# Patient Record
Sex: Female | Born: 1955 | Race: Black or African American | Hispanic: No | Smoking: Former smoker
Health system: Southern US, Community
[De-identification: ages and names within clinical notes are randomized; demographics above are authoritative.]

## PROBLEM LIST (undated history)

## (undated) DIAGNOSIS — Z9109 Other allergy status, other than to drugs and biological substances: Secondary | ICD-10-CM

## (undated) DIAGNOSIS — W19XXXA Unspecified fall, initial encounter: Secondary | ICD-10-CM

## (undated) DIAGNOSIS — M5430 Sciatica, unspecified side: Secondary | ICD-10-CM

## (undated) DIAGNOSIS — R51 Headache: Secondary | ICD-10-CM

## (undated) DIAGNOSIS — G8929 Other chronic pain: Secondary | ICD-10-CM

## (undated) DIAGNOSIS — F419 Anxiety disorder, unspecified: Secondary | ICD-10-CM

## (undated) DIAGNOSIS — M1990 Unspecified osteoarthritis, unspecified site: Secondary | ICD-10-CM

## (undated) DIAGNOSIS — Z8619 Personal history of other infectious and parasitic diseases: Secondary | ICD-10-CM

## (undated) DIAGNOSIS — F32A Depression, unspecified: Secondary | ICD-10-CM

## (undated) DIAGNOSIS — M549 Dorsalgia, unspecified: Secondary | ICD-10-CM

## (undated) DIAGNOSIS — K219 Gastro-esophageal reflux disease without esophagitis: Secondary | ICD-10-CM

## (undated) DIAGNOSIS — J42 Unspecified chronic bronchitis: Secondary | ICD-10-CM

## (undated) DIAGNOSIS — G2581 Restless legs syndrome: Secondary | ICD-10-CM

## (undated) DIAGNOSIS — D649 Anemia, unspecified: Secondary | ICD-10-CM

## (undated) DIAGNOSIS — G4700 Insomnia, unspecified: Secondary | ICD-10-CM

## (undated) DIAGNOSIS — R519 Headache, unspecified: Secondary | ICD-10-CM

## (undated) DIAGNOSIS — F329 Major depressive disorder, single episode, unspecified: Secondary | ICD-10-CM

## (undated) DIAGNOSIS — IMO0001 Reserved for inherently not codable concepts without codable children: Secondary | ICD-10-CM

## (undated) HISTORY — DX: Unspecified osteoarthritis, unspecified site: M19.90

## (undated) HISTORY — DX: Anxiety disorder, unspecified: F41.9

## (undated) HISTORY — DX: Insomnia, unspecified: G47.00

## (undated) HISTORY — DX: Restless legs syndrome: G25.81

---

## 1989-01-21 HISTORY — PX: TUBAL LIGATION: SHX77

## 1989-01-21 HISTORY — PX: CHOLECYSTECTOMY: SHX55

## 2004-03-22 ENCOUNTER — Emergency Department (HOSPITAL_COMMUNITY): Admission: EM | Admit: 2004-03-22 | Discharge: 2004-03-22 | Payer: Self-pay | Admitting: Family Medicine

## 2004-10-18 ENCOUNTER — Emergency Department (HOSPITAL_COMMUNITY): Admission: AD | Admit: 2004-10-18 | Discharge: 2004-10-18 | Payer: Self-pay | Admitting: Family Medicine

## 2005-09-14 ENCOUNTER — Emergency Department (HOSPITAL_COMMUNITY): Admission: EM | Admit: 2005-09-14 | Discharge: 2005-09-14 | Payer: Self-pay | Admitting: Family Medicine

## 2008-03-24 ENCOUNTER — Emergency Department (HOSPITAL_COMMUNITY): Admission: EM | Admit: 2008-03-24 | Discharge: 2008-03-24 | Payer: Self-pay | Admitting: Emergency Medicine

## 2008-04-21 ENCOUNTER — Emergency Department (HOSPITAL_COMMUNITY): Admission: EM | Admit: 2008-04-21 | Discharge: 2008-04-21 | Payer: Self-pay | Admitting: Emergency Medicine

## 2008-05-23 DIAGNOSIS — Z8619 Personal history of other infectious and parasitic diseases: Secondary | ICD-10-CM

## 2008-05-23 HISTORY — DX: Personal history of other infectious and parasitic diseases: Z86.19

## 2008-06-05 ENCOUNTER — Ambulatory Visit: Payer: Self-pay | Admitting: Obstetrics & Gynecology

## 2008-06-09 ENCOUNTER — Ambulatory Visit (HOSPITAL_COMMUNITY): Admission: RE | Admit: 2008-06-09 | Discharge: 2008-06-09 | Payer: Self-pay | Admitting: Obstetrics and Gynecology

## 2008-07-03 ENCOUNTER — Ambulatory Visit: Payer: Self-pay | Admitting: Obstetrics and Gynecology

## 2008-07-03 ENCOUNTER — Encounter: Payer: Self-pay | Admitting: Obstetrics & Gynecology

## 2008-07-03 LAB — CONVERTED CEMR LAB
FSH: 5.2 milliintl units/mL
MCHC: 31.8 g/dL (ref 30.0–36.0)
MCV: 90.2 fL (ref 78.0–100.0)
Platelets: 382 10*3/uL (ref 150–400)
RDW: 13.6 % (ref 11.5–15.5)
WBC: 5.2 10*3/uL (ref 4.0–10.5)

## 2008-07-09 ENCOUNTER — Ambulatory Visit (HOSPITAL_COMMUNITY): Admission: RE | Admit: 2008-07-09 | Discharge: 2008-07-09 | Payer: Self-pay | Admitting: Obstetrics and Gynecology

## 2008-07-27 ENCOUNTER — Emergency Department (HOSPITAL_COMMUNITY): Admission: EM | Admit: 2008-07-27 | Discharge: 2008-07-27 | Payer: Self-pay | Admitting: Family Medicine

## 2009-01-07 ENCOUNTER — Ambulatory Visit: Payer: Self-pay | Admitting: Obstetrics and Gynecology

## 2010-04-08 ENCOUNTER — Ambulatory Visit (HOSPITAL_COMMUNITY): Admission: RE | Admit: 2010-04-08 | Discharge: 2010-04-08 | Payer: Self-pay | Admitting: Internal Medicine

## 2010-06-12 ENCOUNTER — Emergency Department (HOSPITAL_COMMUNITY)
Admission: EM | Admit: 2010-06-12 | Discharge: 2010-06-12 | Payer: Self-pay | Source: Home / Self Care | Admitting: Family Medicine

## 2010-06-14 ENCOUNTER — Encounter: Payer: Self-pay | Admitting: *Deleted

## 2010-10-05 NOTE — Group Therapy Note (Signed)
Catherine, DISMORE.:  [id number]   MEDICAL RECORD NO.:  [id number]          PATIENT TYPE:  WOC   LOCATION:  [facility]                   FACILITY:  [facility]   PHYSICIAN:  Argentina Donovan, MD        DATE OF BIRTH:  Jan 08, 1956   DATE OF SERVICE:  01/07/2009                                  CLINIC NOTE   This is a 55 year old female who comes in for 62-month followup for  uterine fibroids, last visit was seen by Dr. Debroah Loop in February 2010.  At that time, the patient has had an ultrasound done on June 09, 2008, that showed she had 2 posterior uterine fibroids with the largest  being 8.6 cm, and she was having some heavy menstrual bleeding.  Currently, the patient is not bleeding.  Her last period was in March  2010.  This period lasted 4 days and it was considered heavy.  She has  not had any periods since.  She was supposed to have a repeat ultrasound  to evaluate the fibroids in July of this year.  The patient did not make  that appointment.  Since she is not bleeding and has not had periods in  5 months, we have decided that the patient maybe reaching menopause  given that she is age 41.  We have decided to have the patient followup  in January 2011, at that time she will have a repeat ultrasound to  evaluate the fibroids.  Her last Pap smear was in August 2009.  The  patient has no history of abnormal Pap smear and is not sexually active.  She can have a Pap smear every 3 years.   We have concerns regarding her hypertension.  She has had elevated blood  pressure during visits to this clinic with today's blood pressure being  138/92 and previous blood pressures being 154/83 with a recheck of  137/81 and 144/84.  The patient states that she has not had hypertension  in the past and has always had normal blood pressure in her doctor's  office.  She is followed by [facility] in [place].  The  patient states that she was seen there last week and her  blood pressure  at that time was 130/80.  Since the patient is being followed by another  Primary Care Facility, we will defer to them for evaluation/assessment  and treatment of her blood pressure.   I discussed this case with Dr. Okey Dupre prior to discharging the patient to  go home today.      Catherine Nielsen, CNM    ______________________________  Argentina Donovan, MD    WM/MEDQ  D:  01/07/2009  T:  01/08/2009  Job:  [id number]

## 2010-10-05 NOTE — Group Therapy Note (Signed)
NAMEVOLLIE, Catherine Nielsen NO.:  [id number]   MEDICAL RECORD NO.:  [id number]          PATIENT TYPE:  WOC   LOCATION:  [facility]                   FACILITY:  [facility]   PHYSICIAN:  Argentina Donovan, MD        DATE OF BIRTH:  08/03/1955   DATE OF SERVICE:  06/05/2008                                  CLINIC NOTE   The patient is a 55 year old African American female who was referred by  the [facility] after going in for her routine annual physical  examination and to where they did a Pap smear.  During examination, they  found she had an 18-cm pelvic, firm, mobile mass probably consistent  with fibroids.  The patient had irregular periods up until June 2009 and  then stopped having periods altogether.  Then 2 weeks ago, she did have  an episode of bleeding that was very similar to a period which must be  considered postmenopausal bleeding.  She has no other significant  postmenopausal symptoms and she takes only Tylenol and Zyrtec as far as  medication.  She has allergies none known.   PHYSICAL EXAMINATION:  Her blood pressure is 144/84, she weighs 243  pounds, 5 feet 2 inches tall.  On examination, her abdomen is soft,  flat, but a pelvic mass that goes about 3 fingerbreadths below the  umbilicus can be palpated and it feels to be mobile.  External genitalia  is normal.  BUS within normal limits.  Vagina is clean and well rugated.  The cervix is clean, well epithelialized, parous and slightly  hypertrophied.  Bimanually, the uterus feels to be about size of a 16-18-  week pregnancy.  Adnexa course could not be palpated.  Attempt to do  endometrial biopsy was not successful, as I could not put the sound in  more than 2 cm feels to be blocked by probably a fibroid within the  uterine cavity.  We are going to get an ultrasound to try and evaluate  the endometrial stripe if possible and then have the patient come back  in 2 weeks for discussion of findings and possible  planning for  treatment if necessary.   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           ______________________________  Argentina Donovan, MD     PR/MEDQ  D:  06/05/2008  T:  06/06/2008  Job:  [id number]

## 2010-10-05 NOTE — Group Therapy Note (Signed)
Catherine Nielsen, SINNETT NO.:  [id number]   MEDICAL RECORD NO.:  [id number]          PATIENT TYPE:  WOC   LOCATION:  [facility]                   [facility]ACILITY:  [facility]   PHYSICIAN:  Scheryl Darter, MD       DATE OF BIRTH:  1956/02/24   DATE OF SERVICE:                                  CLINIC NOTE   The patient comes in followup after having ultrasound to assess probable  fibroid uterus.  She had a period in June 2009 or July 2009, and then  bleeding about the May 28, 2008.  She says this last period was quite  heavy.  Ultrasound done on June 09, 2008, showed 2 posterior uterine  fibroids, largest in the lower uterine segment measuring 8.6 cm and a  smaller fibroid measuring 3.7 x 3.8 cm.  She has no bleeding now.  No  complaints of pain or pressure.  States she does get hot flashes and  sweats.  She at least is perimenopausal.  Ultrasound showed a 2-mm  endometrial stripe.  We discussed the possibilities of surgery, which is  watchful waiting.  She is amenable to close followup with repeat  ultrasound.  Do a CBC and a FSH today.  She was scheduled repeat  ultrasound in July.  She will notify us if she has continued problems  with bleeding or other symptoms referable to fibroid uterus.      Scheryl Darter, MD     JA/MEDQ  D:  07/03/2008  T:  07/04/2008  Job:  [id number]

## 2010-10-06 ENCOUNTER — Ambulatory Visit: Payer: Self-pay | Admitting: Obstetrics & Gynecology

## 2011-11-25 ENCOUNTER — Encounter (HOSPITAL_COMMUNITY): Payer: Self-pay | Admitting: Physical Medicine and Rehabilitation

## 2011-11-25 ENCOUNTER — Emergency Department (HOSPITAL_COMMUNITY)
Admission: EM | Admit: 2011-11-25 | Discharge: 2011-11-25 | Disposition: A | Discharge status: Home / Self Care | Payer: Self-pay | Attending: Emergency Medicine | Admitting: Emergency Medicine

## 2011-11-25 ENCOUNTER — Emergency Department (HOSPITAL_COMMUNITY): Payer: Self-pay

## 2011-11-25 DIAGNOSIS — F17200 Nicotine dependence, unspecified, uncomplicated: Secondary | ICD-10-CM | POA: Insufficient documentation

## 2011-11-25 DIAGNOSIS — D259 Leiomyoma of uterus, unspecified: Secondary | ICD-10-CM | POA: Insufficient documentation

## 2011-11-25 DIAGNOSIS — N949 Unspecified condition associated with female genital organs and menstrual cycle: Secondary | ICD-10-CM | POA: Insufficient documentation

## 2011-11-25 DIAGNOSIS — N938 Other specified abnormal uterine and vaginal bleeding: Secondary | ICD-10-CM | POA: Insufficient documentation

## 2011-11-25 HISTORY — DX: Reserved for inherently not codable concepts without codable children: IMO0001

## 2011-11-25 LAB — URINALYSIS, ROUTINE W REFLEX MICROSCOPIC
Bilirubin Urine: NEGATIVE
Ketones, ur: NEGATIVE mg/dL
Nitrite: NEGATIVE
Protein, ur: NEGATIVE mg/dL
Specific Gravity, Urine: 1.025 (ref 1.005–1.030)
Urobilinogen, UA: 0.2 mg/dL (ref 0.0–1.0)

## 2011-11-25 LAB — BASIC METABOLIC PANEL
BUN: 12 mg/dL (ref 6–23)
Calcium: 9.3 mg/dL (ref 8.4–10.5)
GFR calc Af Amer: >90 mL/min (ref >90)
GFR calc non Af Amer: >90 mL/min (ref >90)
Potassium: 4.1 mEq/L (ref 3.5–5.1)
Sodium: 138 mEq/L (ref 135–145)

## 2011-11-25 LAB — URINE MICROSCOPIC-ADD ON

## 2011-11-25 LAB — CBC WITH DIFFERENTIAL/PLATELET
Basophils Relative: 1 % (ref 0–1)
Eosinophils Absolute: 0.1 10*3/uL (ref 0.0–0.7)
Eosinophils Relative: 1 % (ref 0–5)
MCH: 30.1 pg (ref 26.0–34.0)
MCHC: 33.9 g/dL (ref 30.0–36.0)
Neutrophils Relative %: 52 % (ref 43–77)
Platelets: 358 10*3/uL (ref 150–400)
RDW: 13 % (ref 11.5–15.5)

## 2011-11-25 LAB — WET PREP, GENITAL: Clue Cells Wet Prep HPF POC: NONE SEEN

## 2011-11-25 NOTE — ED Notes (Signed)
Pt presents to department for evaluation of lower abdominal pain, vaginal bleeding and generalized body aches. Pt states vaginal bleeding x6 months. 5/10 cramping sensation at the time. No nausea/vomiting. States urinary frequency. She is alert and oriented x4.

## 2011-11-25 NOTE — ED Provider Notes (Signed)
History     CSN: [id number]  Arrival date & time 11/25/11  1049   First MD Initiated Contact with Patient 11/25/11 1118      Chief Complaint  Patient presents with  . Generalized Body Aches  . Vaginal Bleeding  . Abdominal Pain    (Consider location/radiation/quality/duration/timing/severity/associated sxs/prior treatment) HPI History from patient. 56 year old female with history of fibroids presents with abnormal vaginal bleeding. She states that she has had persistent vaginal bleeding for the past 6 months. She states this has been nearly continuous, with never more than a day or 2 of cessation of bleeding. Bleeding is heavy at times and she has to go through at least a pad per hour. She has seen clots with this. She reports that she was diagnosed with fibroids 3 years ago and was seen by a GYN at [facility] hospital. She has not followed up with them since. Patient reports that she has had intermittent periods prior to this over about the past year and a half. She was having periods approximately every 2 months, then went 9 months without a period, then went about 2 months before having an other menstrual period.   She denies associated abdominal fullness or pain. She has not had any weight loss or change in appetite. She states that she feels slightly weak but denies any dizziness. Denies urinary symptoms. She is not on any blood thinners.  Past Medical History  Diagnosis Date  . Fibroid (bleeding) (uterine)     No past surgical history on file.  No family history on file.  History  Substance Use Topics  . Smoking status: Current Everyday Smoker    Types: Cigarettes  . Smokeless tobacco: Not on file  . Alcohol Use: No    OB History    Grav Para Term Preterm Abortions TAB SAB Ect Mult Living                  Review of Systems  Constitutional: Negative for fever, chills, activity change and appetite change.  Cardiovascular: Negative for chest pain.  Gastrointestinal:  Positive for abdominal pain. Negative for nausea and vomiting.  Genitourinary: Positive for vaginal bleeding. Negative for dysuria, flank pain, vaginal discharge, menstrual problem and pelvic pain.  Neurological: Negative for dizziness and weakness.  Hematological: Does not bruise/bleed easily.  All other systems reviewed and are negative.    Allergies  Review of patient's allergies indicates no known allergies.  Home Medications   Current Outpatient Rx  Name Route Sig Dispense Refill  . ASCORBIC ACID PO Oral Take 1 tablet by mouth daily.    Marland Kitchen VITAMIN D 1000 UNITS PO TABS Oral Take 1,000 Units by mouth daily.    . CYANOCOBALAMIN PO Oral Take 1 tablet by mouth daily.    . CYCLOBENZAPRINE HCL 10 MG PO TABS Oral Take 10 mg by mouth 3 (three) times daily as needed. For muscle spasms.    . DICLOFENAC SODIUM 75 MG PO TBEC Oral Take 75 mg by mouth 2 (two) times daily.    . IRON PO Oral Take 1 tablet by mouth daily.    Marland Kitchen LORATADINE 10 MG PO TABS Oral Take 10 mg by mouth daily.      BP 140/84  Pulse 96  Temp 98.2 F (36.8 C) (Oral)  Resp 20  SpO2 97%  Physical Exam  Nursing note and vitals reviewed. Constitutional: She appears well-developed and well-nourished. No distress.  HENT:  Head: Normocephalic and atraumatic.  Eyes:  Normal appearance, conjunctivae are not pale in appearance  Neck: Normal range of motion.  Cardiovascular: Normal rate, regular rhythm and normal heart sounds.   Pulmonary/Chest: Effort normal and breath sounds normal. She exhibits no tenderness.  Abdominal: Soft. Bowel sounds are normal. There is no tenderness. There is no rebound and no guarding.       abd obese  Genitourinary:       NT chaperone present during exam Small amt dark blood seen in vaginal vault Uterus is boggy, full to palpation on bimanual No palpable masses  Musculoskeletal: Normal range of motion.  Neurological: She is alert.  Skin: Skin is warm and dry. She is not diaphoretic.    Psychiatric: She has a normal mood and affect.    ED Course  Procedures (including critical care time)  Labs Reviewed  URINALYSIS, ROUTINE W REFLEX MICROSCOPIC - Abnormal; Notable for the following:    APPearance CLOUDY (*)     Hgb urine dipstick LARGE (*)     All other components within normal limits  CBC WITH DIFFERENTIAL - Abnormal; Notable for the following:    Hemoglobin 11.9 (*)     HCT 35.1 (*)     All other components within normal limits  BASIC METABOLIC PANEL - Abnormal; Notable for the following:    Glucose, Bld 118 (*)     All other components within normal limits  URINE MICROSCOPIC-ADD ON - Abnormal; Notable for the following:    Squamous Epithelial / LPF MANY (*)     Bacteria, UA FEW (*)     All other components within normal limits  WET PREP, GENITAL  GC/CHLAMYDIA PROBE AMP, GENITAL   US Transvaginal Non-ob  11/25/2011  *RADIOLOGY REPORT*  Clinical Data: Heavy uterine bleeding.  TRANSABDOMINAL AND TRANSVAGINAL ULTRASOUND OF PELVIS Technique:  Both transabdominal and transvaginal ultrasound examinations of the pelvis were performed. Transabdominal technique was performed for global imaging of the pelvis including uterus, ovaries, adnexal regions, and pelvic cul-de-sac.  It was necessary to proceed with endovaginal exam following the transabdominal exam to visualize the details of the uterus.  Comparison:  06/09/2008  Findings:  Uterus: 18.3 x 9.8 x .4 cm.  Numerous fibroids the largest measuring 9.0 x 7.6 x 5.5 cm.  This is in the lower uterine segment posteriorly.  There is a 5.7 x 6.1 x 5.4 cm fibroid at the fundus of the uterus.  Endometrium: Endometrium is difficult to visualized, being distorted by these large fibroids.  Estimated thickness is 9.6 mm.  Right ovary:  Normal.  2.9 x 2.1 x 2.7 cm.  Left ovary: Normal.  2.5 x 1.7 x 2.2 cm.  Other findings: No free fluid.  No adnexal masses.  IMPRESSION: Large fibroids in the uterus, slightly increased in size since the prior  exam.  The uterus is larger than on the prior study.  Original Report Authenticated By: Gwynn Burly, M.D.   US Pelvis Complete  11/25/2011  *RADIOLOGY REPORT*  Clinical Data: Heavy uterine bleeding.  TRANSABDOMINAL AND TRANSVAGINAL ULTRASOUND OF PELVIS Technique:  Both transabdominal and transvaginal ultrasound examinations of the pelvis were performed. Transabdominal technique was performed for global imaging of the pelvis including uterus, ovaries, adnexal regions, and pelvic cul-de-sac.  It was necessary to proceed with endovaginal exam following the transabdominal exam to visualize the details of the uterus.  Comparison:  06/09/2008  Findings:  Uterus: 18.3 x 9.8 x .4 cm.  Numerous fibroids the largest measuring 9.0 x 7.6 x 5.5 cm.  This  is in the lower uterine segment posteriorly.  There is a 5.7 x 6.1 x 5.4 cm fibroid at the fundus of the uterus.  Endometrium: Endometrium is difficult to visualized, being distorted by these large fibroids.  Estimated thickness is 9.6 mm.  Right ovary:  Normal.  2.9 x 2.1 x 2.7 cm.  Left ovary: Normal.  2.5 x 1.7 x 2.2 cm.  Other findings: No free fluid.  No adnexal masses.  IMPRESSION: Large fibroids in the uterus, slightly increased in size since the prior exam.  The uterus is larger than on the prior study.  Original Report Authenticated By: Gwynn Burly, M.D.     1. Fibroid uterus   2. DUB (dysfunctional uterine bleeding)       MDM  Patient presents with abnormal vaginal bleeding however reportedly the past 6 months. Her hemoglobin is stable at 11.9, which is consistent with previous. Patient's ultrasound shows slight thickening of the endometrium and enlargement of fibroids which were previously seen. Patient has been seen by Dr. Debroah Loop at [facility] hospital in the past. She was instructed to make a followup with him. Reasons to return to the emergency department discussed. Patient verbalized understanding and agreed to plan.        Grant Fontana, PA-C 11/25/11 1521

## 2011-11-25 NOTE — ED Notes (Signed)
Pt has fibroids and had infrequent menstrals and states that since December 2012 has been having constant menstral bleeding and states sometimes it is very heavy.  Pt is reporting tiring, fatigued and aches in thighs.  Pt has a pulling sensation in lower abdomen

## 2011-11-26 LAB — GC/CHLAMYDIA PROBE AMP, GENITAL
Chlamydia, DNA Probe: NEGATIVE
GC Probe Amp, Genital: NEGATIVE

## 2011-11-26 NOTE — ED Provider Notes (Signed)
Medical screening examination/treatment/procedure(s) were performed by non-physician practitioner and as supervising physician I was immediately available for consultation/collaboration.  Jeramy Dimmick T Tjay Velazquez, MD 11/26/11 0821 

## 2011-12-21 ENCOUNTER — Other Ambulatory Visit (HOSPITAL_COMMUNITY)
Admission: RE | Admit: 2011-12-21 | Discharge: 2011-12-21 | Disposition: A | Discharge status: Home / Self Care | Payer: Self-pay | Source: Ambulatory Visit | Attending: Obstetrics & Gynecology | Admitting: Obstetrics & Gynecology

## 2011-12-21 ENCOUNTER — Ambulatory Visit (INDEPENDENT_AMBULATORY_CARE_PROVIDER_SITE_OTHER): Payer: Self-pay | Admitting: Obstetrics & Gynecology

## 2011-12-21 ENCOUNTER — Encounter: Payer: Self-pay | Admitting: Obstetrics & Gynecology

## 2011-12-21 VITALS — BP 151/90 | HR 86 | Temp 98.1°F | Resp 12 | Ht 63.0 in | Wt 259.8 lb

## 2011-12-21 DIAGNOSIS — D259 Leiomyoma of uterus, unspecified: Secondary | ICD-10-CM | POA: Insufficient documentation

## 2011-12-21 DIAGNOSIS — N938 Other specified abnormal uterine and vaginal bleeding: Secondary | ICD-10-CM | POA: Insufficient documentation

## 2011-12-21 DIAGNOSIS — N921 Excessive and frequent menstruation with irregular cycle: Secondary | ICD-10-CM | POA: Insufficient documentation

## 2011-12-21 DIAGNOSIS — Z01812 Encounter for preprocedural laboratory examination: Secondary | ICD-10-CM

## 2011-12-21 DIAGNOSIS — N949 Unspecified condition associated with female genital organs and menstrual cycle: Secondary | ICD-10-CM

## 2011-12-21 DIAGNOSIS — N920 Excessive and frequent menstruation with regular cycle: Secondary | ICD-10-CM

## 2011-12-21 LAB — POCT PREGNANCY, URINE: Preg Test, Ur: NEGATIVE

## 2011-12-21 NOTE — Progress Notes (Signed)
Patient ID: Catherine Nielsen, female   DOB: [birth year], 56 y.o.   MRN: [id number]  Recheck BP = 161/88

## 2011-12-21 NOTE — Patient Instructions (Signed)
Hysterectomy Information  A hysterectomy is a procedure where your uterus is surgically removed. It will no longer be possible to have menstrual periods or to become pregnant. The tubes and ovaries can be removed (bilateral salpingo-oopherectomy) during this surgery as well.  REASONS FOR A HYSTERECTOMY  Persistent, abnormal bleeding.   Lasting (chronic) pelvic pain or infection.   The lining of the uterus (endometrium) starts growing outside the uterus (endometriosis).   The endometrium starts growing in the muscle of the uterus (adenomyosis).   The uterus falls down into the vagina (pelvic organ prolapse).   Symptomatic uterine fibroids.   Precancerous cells.   Cervical cancer or uterine cancer.  TYPES OF HYSTERECTOMIES  Supracervical hysterectomy. This type removes the top part of the uterus, but not the cervix.   Total hysterectomy. This type removes the uterus and cervix.   Radical hysterectomy. This type removes the uterus, cervix, and the fibrous tissue that holds the uterus in place in the pelvis (parametrium).  WAYS A HYSTERECTOMY CAN BE PERFORMED  Abdominal hysterectomy. A large surgical cut (incision) is made in the abdomen. The uterus is removed through this incision.   Vaginal hysterectomy. An incision is made in the vagina. The uterus is removed through this incision. There are no abdominal incisions.   Conventional laparoscopic hysterectomy. A thin, lighted tube with a camera (laparoscope) is inserted into 3 or 4 small incisions in the abdomen. The uterus is cut into small pieces. The small pieces are removed through the incisions, or they are removed through the vagina.   Laparoscopic assisted vaginal hysterectomy (LAVH). Three or four small incisions are made in the abdomen. Part of the surgery is performed laparoscopically and part vaginally. The uterus is removed through the vagina.   Robot-assisted laparoscopic hysterectomy. A laparoscope is inserted into 3 or 4  small incisions in the abdomen. A computer-controlled device is used to give the surgeon a 3D image. This allows for more precise movements of surgical instruments. The uterus is cut into small pieces and removed through the incisions or removed through the vagina.  RISKS OF HYSTERECTOMY   Bleeding and risk of blood transfusion. Tell your caregiver if you do not want to receive any blood products.   Blood clots in the legs or lung.   Infection.   Injury to surrounding organs.   Anesthesia problems or side effects.   Conversion to an abdominal hysterectomy.  WHAT TO EXPECT AFTER A HYSTERECTOMY  You will be given pain medicine.   You will need to have someone with you for the first 3 to 5 days after you go home.   You will need to follow up with your surgeon in 2 to 4 weeks after surgery to evaluate your progress.   You may have early menopause symptoms like hot flashes, night sweats, and insomnia.   If you had a hysterectomy for a problem that was not a cancer or a condition that could lead to cancer, then you no longer need Pap tests. However, even if you no longer need a Pap test, a regular exam is a good idea to make sure no other problems are starting.  Document Released: 11/02/2000 Document Revised: 04/28/2011 Document Reviewed: 12/18/2010 [employer] Patient Information 2012 [employer]. 

## 2011-12-21 NOTE — Progress Notes (Signed)
Patient ID: Catherine Nielsen, female   DOB: Apr 30, 1956, 56 y.o.   MRN: [id number]  Chief Complaint  Patient presents with  . Fibroids    HPI Catherine Nielsen is a 56 y.o. female.  Patient was seen and [facility] the ED 11/25/2011 with persistent vaginal bleeding. She had irregular bleeding after having several months of amenorrhea last year. Beginning end of 2012 she would bleed fairly consistently, with episodes of no bleeding lasting only about a week or 2. She has lower bowel pressure and pain and and mass effect. She has urinary urge. She's been known to have fibroid uterus. HPI  Past Medical History  Diagnosis Date  . Fibroid (bleeding) (uterine)   . Arthritis   . RLS (restless legs syndrome)   . Insomnia   . Anxiety     Past Surgical History  Procedure Date  . Cholecystectomy     Family History  Problem Relation Age of Onset  . Cancer Mother   . Diabetes Mother   . Hypertension Father   . Diabetes Sister   . Hyperlipidemia Sister     Social History History  Substance Use Topics  . Smoking status: Current Everyday Smoker -- 0.2 packs/day for 30 years    Types: Cigarettes  . Smokeless tobacco: Never Used  . Alcohol Use: Yes    No Known Allergies  Current Outpatient Prescriptions  Medication Sig Dispense Refill  . ASCORBIC ACID PO Take 1 tablet by mouth daily.      . cholecalciferol (VITAMIN D) 1000 UNITS tablet Take 1,000 Units by mouth daily.      . CYANOCOBALAMIN PO Take 1 tablet by mouth daily.      . cyclobenzaprine (FLEXERIL) 10 MG tablet Take 10 mg by mouth 3 (three) times daily as needed. For muscle spasms.      . diclofenac (VOLTAREN) 75 MG EC tablet Take 75 mg by mouth 2 (two) times daily.      . IRON PO Take 1 tablet by mouth daily.      Marland Kitchen loratadine (CLARITIN) 10 MG tablet Take 10 mg by mouth daily.        Review of Systems Review of Systems  Constitutional: Negative.   Gastrointestinal: Positive for abdominal pain. Negative for nausea, diarrhea,  constipation and rectal pain.  Genitourinary: Positive for urgency, frequency and vaginal bleeding. Negative for dysuria and dyspareunia.    Blood pressure 151/90, pulse 86, temperature 98.1 F (36.7 C), temperature source Oral, resp. rate 12, height 5\' 3"  (1.6 m), weight 259 lb 12.8 oz (117.845 kg).  Physical Exam Physical Exam  Constitutional: She appears well-developed. No distress.        obese  Pulmonary/Chest: Effort normal.  Abdominal: Soft. Mass: firm lower abdominal mass to just below the umbilicus. There is no tenderness. There is no rebound.  Genitourinary: Vagina normal.       Uterus about 16 weeks size firm consistent with fibroids  Neurological: She is alert.  Skin: Skin is warm and dry.  Psychiatric: She has a normal mood and affect. Her behavior is normal.   I offered an endometrial biopsy due to endometrial thickening and her menometrorrhagia. Explained the procedure and the risks. She signed consent and timeout was done. Milex sampler was passed about 13 cm and some bloody material was obtained. She tolerated this well. Specimen sent to pathology.  Data Reviewed     *RADIOLOGY REPORT*  Clinical Data: Heavy uterine bleeding.  TRANSABDOMINAL AND TRANSVAGINAL ULTRASOUND OF PELVIS  Technique: Both  transabdominal and transvaginal ultrasound  examinations of the pelvis were performed. Transabdominal technique  was performed for global imaging of the pelvis including uterus,  ovaries, adnexal regions, and pelvic cul-de-sac.  It was necessary to proceed with endovaginal exam following the  transabdominal exam to visualize the details of the uterus.  Comparison: 06/09/2008  Findings:  Uterus: 18.3 x 9.8 x .4 cm. Numerous fibroids the largest  measuring 9.0 x 7.6 x 5.5 cm. This is in the lower uterine segment  posteriorly. There is a 5.7 x 6.1 x 5.4 cm fibroid at the fundus  of the uterus.  Endometrium: Endometrium is difficult to visualized, being  distorted by these  large fibroids. Estimated thickness is 9.6 mm.  Right ovary: Normal. 2.9 x 2.1 x 2.7 cm.  Left ovary: Normal. 2.5 x 1.7 x 2.2 cm.  Other findings: No free fluid. No adnexal masses.  IMPRESSION:  Large fibroids in the uterus, slightly increased in size since the  prior exam. The uterus is larger than on the prior study.  Original Report Authenticated By: Gwynn Burly, M.D.     Assessment    Symptomatic fibroid uterus with menometrorrhagia.    Plan    We will get records from her visit last year for medical when she had a Pap test. She will return to review the results of her endometrial biopsy. She is candidate for abdominal hysterectomy and I explained this to her and gave her information on hysterectomy today.       Nielsen,Catherine 12/21/2011, 5:10 PM

## 2011-12-22 ENCOUNTER — Encounter: Payer: Self-pay | Admitting: Obstetrics & Gynecology

## 2011-12-26 ENCOUNTER — Encounter: Payer: Self-pay | Admitting: *Deleted

## 2012-01-13 ENCOUNTER — Encounter: Payer: Self-pay | Admitting: Obstetrics & Gynecology

## 2012-01-13 ENCOUNTER — Ambulatory Visit (INDEPENDENT_AMBULATORY_CARE_PROVIDER_SITE_OTHER): Payer: Self-pay | Admitting: Obstetrics & Gynecology

## 2012-01-13 VITALS — BP 163/98 | HR 92 | Temp 97.2°F | Resp 12 | Ht 63.0 in | Wt 254.8 lb

## 2012-01-13 DIAGNOSIS — D259 Leiomyoma of uterus, unspecified: Secondary | ICD-10-CM

## 2012-01-13 MED ORDER — MEDROXYPROGESTERONE ACETATE 5 MG PO TABS: 5.0000 mg | ORAL_TABLET | Freq: Every day | ORAL | Status: DC

## 2012-01-13 NOTE — Progress Notes (Signed)
Patient ID: Catherine Nielsen, female   DOB: [birth year], 56 y.o.   MRN: [id number] Patient ID: Catherine Nielsen, female DOB: [birth year], 56 y.o. MRN: [id number]  Chief Complaint   Patient presents with   .  Fibroids   HPI  Catherine Nielsen is a 56 y.o. female. Patient was seen and [place] [facility] the ED 11/25/2011 with persistent vaginal bleeding. She had irregular bleeding after having several months of amenorrhea last year. Beginning end of 2012 she would bleed fairly consistently, with episodes of no bleeding lasting only about a week or 2. She has lower bowel pressure and pain and and mass effect. She has urinary urge. She's been known to have fibroid uterus.  HPI  Past Medical History   Diagnosis  Date   .  Fibroid (bleeding) (uterine)    .  Arthritis    .  RLS (restless legs syndrome)    .  Insomnia    .  Anxiety     Past Surgical History   Procedure  Date   .  Cholecystectomy     Family History   Problem  Relation  Age of Onset   .  Cancer  Mother    .  Diabetes  Mother    .  Hypertension  Father    .  Diabetes  Sister    .  Hyperlipidemia  Sister    Social History  History   Substance Use Topics   .  Smoking status:  Current Everyday Smoker -- 0.2 packs/day for 30 years     Types:  Cigarettes   .  Smokeless tobacco:  Never Used   .  Alcohol Use:  Yes   No Known Allergies  Current Outpatient Prescriptions   Medication  Sig  Dispense  Refill   .  ASCORBIC ACID PO  Take 1 tablet by mouth daily.     .  cholecalciferol (VITAMIN D) 1000 UNITS tablet  Take 1,000 Units by mouth daily.     .  CYANOCOBALAMIN PO  Take 1 tablet by mouth daily.     .  cyclobenzaprine (FLEXERIL) 10 MG tablet  Take 10 mg by mouth 3 (three) times daily as needed. For muscle spasms.     .  diclofenac (VOLTAREN) 75 MG EC tablet  Take 75 mg by mouth 2 (two) times daily.     .  IRON PO  Take 1 tablet by mouth daily.     Marland Kitchen  loratadine (CLARITIN) 10 MG tablet  Take 10 mg by mouth daily.     Review of Systems  Review of  Systems  Constitutional: Negative.  Gastrointestinal: Positive for abdominal pain. Negative for nausea, diarrhea, constipation and rectal pain.  Genitourinary: Positive for urgency, frequency and vaginal bleeding. Negative for dysuria and dyspareunia.  Blood pressure 151/90, pulse 86, temperature 98.1 F (36.7 C), temperature source Oral, resp. rate 12, height 5\' 3"  (1.6 m), weight 259 lb 12.8 oz (117.845 kg).  Physical Exam  Physical Exam  Constitutional: She appears well-developed. No distress.  obese  Pulmonary/Chest: Effort normal.  Abdominal: Soft. Mass: firm lower abdominal mass to just below the umbilicus. There is no tenderness. There is no rebound.  Genitourinary: Vagina normal.  Uterus about 16 weeks size firm consistent with fibroids  Neurological: She is alert.  Skin: Skin is warm and dry.  Psychiatric: She has a normal mood and affect. Her behavior is normal.  I offered an endometrial biopsy due to endometrial thickening and her menometrorrhagia. Explained the procedure  and the risks. She signed consent and timeout was done. Milex sampler was passed about 13 cm and some bloody material was obtained. She tolerated this well. Specimen sent to pathology.  Data Reviewed      *RADIOLOGY REPORT*  Clinical Data: Heavy uterine bleeding.  TRANSABDOMINAL AND TRANSVAGINAL ULTRASOUND OF PELVIS  Technique: Both transabdominal and transvaginal ultrasound  examinations of the pelvis were performed. Transabdominal technique  was performed for global imaging of the pelvis including uterus,  ovaries, adnexal regions, and pelvic cul-de-sac.  It was necessary to proceed with endovaginal exam following the  transabdominal exam to visualize the details of the uterus.  Comparison: 06/09/2008  Findings:  Uterus: 18.3 x 9.8 x .4 cm. Numerous fibroids the largest  measuring 9.0 x 7.6 x 5.5 cm. This is in the lower uterine segment  posteriorly. There is a 5.7 x 6.1 x 5.4 cm fibroid at the fundus    of the uterus.  Endometrium: Endometrium is difficult to visualized, being  distorted by these large fibroids. Estimated thickness is 9.6 mm.  Right ovary: Normal. 2.9 x 2.1 x 2.7 cm.  Left ovary: Normal. 2.5 x 1.7 x 2.2 cm.  Other findings: No free fluid. No adnexal masses.  IMPRESSION:  Large fibroids in the uterus, slightly increased in size since the  prior exam. The uterus is larger than on the prior study.  Original Report Authenticated By: Gwynn Burly, M.D.   Assessment  Symptomatic fibroid uterus with menometrorrhagia.  Plan  We will get records from her visit last year for medical when she had a Pap test. She will return to review the results of her endometrial biopsy. She is candidate for abdominal hysterectomy and I explained this to her and gave her information on hysterectomy today.  Catherine Nielsen  12/21/2011, 5:10 PM Above note reviewed.Endometrial biopsy was benign, no hyperplasia, fragments of polyp. I would like to schedule abdominal hysterectomy, possible BSO. She needs to wait until the end of current semester.  Need pap result [facility]. RTC 11/13 to schedule surgery. Rx Provera 5 mg po daily.  Catherine Nielsen 01/13/2012 12:47 PM

## 2012-01-13 NOTE — Patient Instructions (Signed)
Hysterectomy Information  A hysterectomy is a procedure where your uterus is surgically removed. It will no longer be possible to have menstrual periods or to become pregnant. The tubes and ovaries can be removed (bilateral salpingo-oopherectomy) during this surgery as well.  REASONS FOR A HYSTERECTOMY  Persistent, abnormal bleeding.   Lasting (chronic) pelvic pain or infection.   The lining of the uterus (endometrium) starts growing outside the uterus (endometriosis).   The endometrium starts growing in the muscle of the uterus (adenomyosis).   The uterus falls down into the vagina (pelvic organ prolapse).   Symptomatic uterine fibroids.   Precancerous cells.   Cervical cancer or uterine cancer.  TYPES OF HYSTERECTOMIES  Supracervical hysterectomy. This type removes the top part of the uterus, but not the cervix.   Total hysterectomy. This type removes the uterus and cervix.   Radical hysterectomy. This type removes the uterus, cervix, and the fibrous tissue that holds the uterus in place in the pelvis (parametrium).  WAYS A HYSTERECTOMY CAN BE PERFORMED  Abdominal hysterectomy. A large surgical cut (incision) is made in the abdomen. The uterus is removed through this incision.   Vaginal hysterectomy. An incision is made in the vagina. The uterus is removed through this incision. There are no abdominal incisions.   Conventional laparoscopic hysterectomy. A thin, lighted tube with a camera (laparoscope) is inserted into 3 or 4 small incisions in the abdomen. The uterus is cut into small pieces. The small pieces are removed through the incisions, or they are removed through the vagina.   Laparoscopic assisted vaginal hysterectomy (LAVH). Three or four small incisions are made in the abdomen. Part of the surgery is performed laparoscopically and part vaginally. The uterus is removed through the vagina.   Robot-assisted laparoscopic hysterectomy. A laparoscope is inserted into 3 or 4  small incisions in the abdomen. A computer-controlled device is used to give the surgeon a 3D image. This allows for more precise movements of surgical instruments. The uterus is cut into small pieces and removed through the incisions or removed through the vagina.  RISKS OF HYSTERECTOMY   Bleeding and risk of blood transfusion. Tell your caregiver if you do not want to receive any blood products.   Blood clots in the legs or lung.   Infection.   Injury to surrounding organs.   Anesthesia problems or side effects.   Conversion to an abdominal hysterectomy.  WHAT TO EXPECT AFTER A HYSTERECTOMY  You will be given pain medicine.   You will need to have someone with you for the first 3 to 5 days after you go home.   You will need to follow up with your surgeon in 2 to 4 weeks after surgery to evaluate your progress.   You may have early menopause symptoms like hot flashes, night sweats, and insomnia.   If you had a hysterectomy for a problem that was not a cancer or a condition that could lead to cancer, then you no longer need Pap tests. However, even if you no longer need a Pap test, a regular exam is a good idea to make sure no other problems are starting.  Document Released: 11/02/2000 Document Revised: 04/28/2011 Document Reviewed: 12/18/2010 [employer] Patient Information 2012 [employer]. 

## 2012-01-25 ENCOUNTER — Encounter (HOSPITAL_COMMUNITY): Payer: Self-pay

## 2012-01-25 ENCOUNTER — Emergency Department (HOSPITAL_COMMUNITY)
Admission: EM | Admit: 2012-01-25 | Discharge: 2012-01-25 | Disposition: A | Discharge status: Home / Self Care | Payer: Self-pay | Source: Home / Self Care | Attending: Emergency Medicine | Admitting: Emergency Medicine

## 2012-01-25 DIAGNOSIS — R05 Cough: Secondary | ICD-10-CM

## 2012-01-25 DIAGNOSIS — J069 Acute upper respiratory infection, unspecified: Secondary | ICD-10-CM

## 2012-01-25 DIAGNOSIS — J45909 Unspecified asthma, uncomplicated: Secondary | ICD-10-CM

## 2012-01-25 MED ORDER — DOXYCYCLINE HYCLATE 100 MG PO CAPS: 100.0000 mg | ORAL_CAPSULE | Freq: Two times a day (BID) | ORAL | Status: AC

## 2012-01-25 MED ORDER — GUAIFENESIN-CODEINE 100-10 MG/5ML PO SYRP: 5.0000 mL | ORAL_SOLUTION | Freq: Three times a day (TID) | ORAL | Status: AC | PRN

## 2012-01-25 MED ORDER — PREDNISONE 10 MG PO TABS: 20.0000 mg | ORAL_TABLET | Freq: Every day | ORAL | Status: AC

## 2012-01-25 MED ORDER — ALBUTEROL SULFATE HFA 108 (90 BASE) MCG/ACT IN AERS
1.0000 | INHALATION_SPRAY | Freq: Four times a day (QID) | RESPIRATORY_TRACT | Status: DC | PRN
Start: 2012-01-25 — End: 2013-07-16

## 2012-01-25 NOTE — ED Notes (Signed)
C/o generalized aches and pains , congestion, cough, worse at night; NAD at present

## 2012-01-25 NOTE — ED Provider Notes (Signed)
History     CSN: [id number]  Arrival date & time 01/25/12  1116   First MD Initiated Contact with Patient 01/25/12 1124      Chief Complaint  Patient presents with  . URI    (Consider location/radiation/quality/duration/timing/severity/associated sxs/prior treatment) HPI Comments: Patient presents urgent care this early afternoon complaining of an ongoing cough for about 6 days now she describes it she has had some chills and it started with her nose being stuffy and congested at one point runny. Some discomfort in her throat also was experiencing occasionally her cough does elicit some sputum which she describes as "mucus". Patient denies any wheezing or shortness of breath. Myalgias arthralgias or changes in appetite. She has been treated on other instances with antibiotics for similar symptoms. She is also describing that perhaps she feels that sometimes her environment mainly the dirty carpets and strong perfumes make her cough frequently.She has tried some over-the-counter [employer] brand of sinus decongestants.  Patient is a 56 y.o. female presenting with URI. The history is provided by the patient.  URI The primary symptoms include cough. Primary symptoms do not include fever, fatigue, sore throat, wheezing, myalgias, arthralgias or rash. The current episode started 6 to 7 days ago. This is a new problem.  Symptoms associated with the illness include chills, sinus pressure, congestion and rhinorrhea.    Past Medical History  Diagnosis Date  . Fibroid (bleeding) (uterine)   . Arthritis   . RLS (restless legs syndrome)   . Insomnia   . Anxiety     Past Surgical History  Procedure Date  . Cholecystectomy   . Tubal ligation     Family History  Problem Relation Age of Onset  . Cancer Mother   . Diabetes Mother   . Hypertension Father   . Diabetes Sister   . Hyperlipidemia Sister     History  Substance Use Topics  . Smoking status: Current Everyday Smoker -- 0.2  packs/day for 30 years    Types: Cigarettes  . Smokeless tobacco: Never Used  . Alcohol Use: No    OB History    Grav Para Term Preterm Abortions TAB SAB Ect Mult Living                  Review of Systems  Constitutional: Positive for chills. Negative for fever and fatigue.  HENT: Positive for congestion, rhinorrhea and sinus pressure. Negative for sore throat.   Respiratory: Positive for cough. Negative for wheezing.   Musculoskeletal: Negative for myalgias and arthralgias.  Skin: Negative for rash.    Allergies  Review of patient's allergies indicates no known allergies.  Home Medications   Current Outpatient Rx  Name Route Sig Dispense Refill  . ALBUTEROL SULFATE HFA 108 (90 BASE) MCG/ACT IN AERS Inhalation Inhale 1-2 puffs into the lungs every 6 (six) hours as needed for wheezing. 1 Inhaler 0  . ASCORBIC ACID PO Oral Take 1 tablet by mouth daily.    Marland Kitchen VITAMIN D 1000 UNITS PO TABS Oral Take 1,000 Units by mouth daily.    Marland Kitchen COENZYME Q10 30 MG PO CAPS Oral Take 30 mg by mouth daily.    . CYANOCOBALAMIN PO Oral Take 1 tablet by mouth daily.    . CYCLOBENZAPRINE HCL 10 MG PO TABS Oral Take 10 mg by mouth 3 (three) times daily as needed. For muscle spasms.    . DICLOFENAC SODIUM 75 MG PO TBEC Oral Take 75 mg by mouth 2 (two) times daily.    Marland Kitchen  DOXYCYCLINE HYCLATE 100 MG PO CAPS Oral Take 1 capsule (100 mg total) by mouth 2 (two) times daily. 20 capsule 0  . GUAIFENESIN-CODEINE 100-10 MG/5ML PO SYRP Oral Take 5 mLs by mouth 3 (three) times daily as needed for cough. 120 mL 0  . IRON PO Oral Take 1 tablet by mouth daily.    Marland Kitchen LORATADINE 10 MG PO TABS Oral Take 10 mg by mouth daily.    Marland Kitchen MEDROXYPROGESTERONE ACETATE 5 MG PO TABS Oral Take 1 tablet (5 mg total) by mouth daily. 30 tablet 3  . PREDNISONE 10 MG PO TABS Oral Take 2 tablets (20 mg total) by mouth daily. 15 tablet 0    BP 146/80  Pulse 96  Temp 98.1 F (36.7 C) (Oral)  Resp 20  SpO2 99%  LMP 01/02/2012  Physical  Exam  ED Course  Procedures (including critical care time)  Labs Reviewed - No data to display No results found.   1. Upper respiratory infection   2. Cough   3. Reactive airway disease       MDM  Reactive cough versus upper respiratory process. Have encouraged patient to use a decongestant with an antihistamine and use prednisone and albuterol with an antitussive. For the next 2-3 days. Patient seemed to be describing to coexistent symptomatologies and seemed to be consistent with both a reactive cough or allergenic cough versus a viral upper respiratory syndrome. Have provided her with an antibiotic prescription she was argumentative that previously she has been treated with antibiotics and that seems to be the only thing that works for her. I have argued and discuss that at this point I don't feel she needs antibiotics as this is most unlikely to be a bacterial process she agrees that she will wait 2-3 days and try with other medicines first and if no improvement she will proceed and take the provided doxycycline.       Jimmie Molly, MD 01/25/12 1240

## 2012-06-29 ENCOUNTER — Encounter (HOSPITAL_COMMUNITY): Payer: Self-pay

## 2012-06-29 ENCOUNTER — Emergency Department (INDEPENDENT_AMBULATORY_CARE_PROVIDER_SITE_OTHER)
Admission: EM | Admit: 2012-06-29 | Discharge: 2012-06-29 | Disposition: A | Discharge status: Home / Self Care | Payer: No Typology Code available for payment source | Source: Home / Self Care | Attending: Family Medicine | Admitting: Family Medicine

## 2012-06-29 DIAGNOSIS — M5430 Sciatica, unspecified side: Secondary | ICD-10-CM

## 2012-06-29 DIAGNOSIS — D259 Leiomyoma of uterus, unspecified: Secondary | ICD-10-CM

## 2012-06-29 DIAGNOSIS — G2581 Restless legs syndrome: Secondary | ICD-10-CM

## 2012-06-29 NOTE — ED Notes (Signed)
Patient states has body aches and pains Fibroids Right heal hurst Backs of her legs hurt

## 2012-06-29 NOTE — ED Provider Notes (Signed)
History   CSN: [id number]  Arrival date & time 06/29/12  1552   First MD Initiated Contact with Patient 06/29/12 1643     Chief Complaint  Patient presents with  . Leg Pain   HPI Pt presented today for an appointment.  She has been to multiple clinics.  Pt says that she has been treated for RLS.  Pt says that she was taking diclofenac for RLS.  Pt says that she says that she was going to [facility] as recently as Jan and was supposed to have some tests done but she does not know what the tests were or why they were ordered and has no medical records with her.    Past Medical History  Diagnosis Date  . Fibroid (bleeding) (uterine)   . Arthritis   . RLS (restless legs syndrome)   . Insomnia   . Anxiety     Past Surgical History  Procedure Date  . Cholecystectomy   . Tubal ligation     Family History  Problem Relation Age of Onset  . Cancer Mother   . Diabetes Mother   . Hypertension Father   . Diabetes Sister   . Hyperlipidemia Sister     History  Substance Use Topics  . Smoking status: Current Every Day Smoker -- 0.2 packs/day for 30 years    Types: Cigarettes  . Smokeless tobacco: Never Used  . Alcohol Use: No    OB History    Grav Para Term Preterm Abortions TAB SAB Ect Mult Living                 Review of Systems  Musculoskeletal: Positive for back pain and arthralgias.       Muscle spasms and RLS  All other systems reviewed and are negative.    Allergies  Review of patient's allergies indicates no known allergies.  Home Medications   Current Outpatient Rx  Name  Route  Sig  Dispense  Refill  . HYDROCODONE-ACETAMINOPHEN 5-325 MG PO TABS   Oral   Take 1 tablet by mouth every 6 (six) hours as needed.         . ALBUTEROL SULFATE HFA 108 (90 BASE) MCG/ACT IN AERS   Inhalation   Inhale 1-2 puffs into the lungs every 6 (six) hours as needed for wheezing.   1 Inhaler   0   . ASCORBIC ACID PO   Oral   Take 1 tablet by mouth daily.         Marland Kitchen VITAMIN D 1000 UNITS PO TABS   Oral   Take 1,000 Units by mouth daily.         Marland Kitchen COENZYME Q10 30 MG PO CAPS   Oral   Take 30 mg by mouth daily.         . CYANOCOBALAMIN PO   Oral   Take 1 tablet by mouth daily.         . CYCLOBENZAPRINE HCL 10 MG PO TABS   Oral   Take 10 mg by mouth 3 (three) times daily as needed. For muscle spasms.         . DICLOFENAC SODIUM 75 MG PO TBEC   Oral   Take 75 mg by mouth 2 (two) times daily.         . IRON PO   Oral   Take 1 tablet by mouth daily.         Marland Kitchen LORATADINE 10 MG PO TABS   Oral  Take 10 mg by mouth daily.         Marland Kitchen MEDROXYPROGESTERONE ACETATE 5 MG PO TABS   Oral   Take 1 tablet (5 mg total) by mouth daily.   30 tablet   3     BP 129/97  Pulse 115  Temp 97.7 F (36.5 C) (Oral)  Resp 17  SpO2 98%  Physical Exam  Nursing note and vitals reviewed. Constitutional: She is oriented to person, place, and time. She appears well-developed and well-nourished. No distress.  HENT:  Head: Normocephalic and atraumatic.  Right Ear: External ear normal.  Left Ear: External ear normal.  Eyes: Conjunctivae normal and EOM are normal. Pupils are equal, round, and reactive to light.  Neck: Normal range of motion. Neck supple. No JVD present. No thyromegaly present.  Cardiovascular: Normal rate, regular rhythm and normal heart sounds.   Pulmonary/Chest: Effort normal and breath sounds normal. No respiratory distress. She has no wheezes. She has no rales. She exhibits no tenderness.  Abdominal: Soft. Bowel sounds are normal. She exhibits no distension and no mass. There is no tenderness. There is no rebound and no guarding.  Lymphadenopathy:    She has no cervical adenopathy.  Neurological: She is alert and oriented to person, place, and time. She has normal reflexes.  Skin: Skin is warm and dry.  Psychiatric: She has a normal mood and affect. Her behavior is normal. Judgment and thought content normal.    ED  Course  Procedures (including critical care time)  Labs Reviewed - No data to display No results found.  No diagnosis found.  MDM  IMPRESSION  RLS  Back Spasms   Allergic Rhinitis  Uterine fibroids  RECOMMENDATIONS / PLAN Obtain old records from [facility] and from [facility]  Pt declined to have labs done today Pt declined flu vaccine  FOLLOW UP 6 weeks   The patient was given clear instructions to go to ER or return to medical center if symptoms don't improve, worsen or new problems develop.  The patient verbalized understanding.  The patient was told to call to get lab results if they haven't heard anything in the next week.            Cleora Fleet, MD 06/29/12 [phone]

## 2012-08-10 ENCOUNTER — Emergency Department (INDEPENDENT_AMBULATORY_CARE_PROVIDER_SITE_OTHER)
Admission: EM | Admit: 2012-08-10 | Discharge: 2012-08-10 | Disposition: A | Discharge status: Home / Self Care | Payer: No Typology Code available for payment source | Source: Home / Self Care | Attending: Emergency Medicine | Admitting: Emergency Medicine

## 2012-08-10 ENCOUNTER — Encounter (HOSPITAL_COMMUNITY): Payer: Self-pay | Admitting: *Deleted

## 2012-08-10 DIAGNOSIS — J309 Allergic rhinitis, unspecified: Secondary | ICD-10-CM | POA: Diagnosis present

## 2012-08-10 DIAGNOSIS — Z13220 Encounter for screening for lipoid disorders: Secondary | ICD-10-CM

## 2012-08-10 DIAGNOSIS — G8929 Other chronic pain: Secondary | ICD-10-CM | POA: Diagnosis present

## 2012-08-10 DIAGNOSIS — Z1329 Encounter for screening for other suspected endocrine disorder: Secondary | ICD-10-CM

## 2012-08-10 DIAGNOSIS — M549 Dorsalgia, unspecified: Secondary | ICD-10-CM

## 2012-08-10 DIAGNOSIS — E669 Obesity, unspecified: Secondary | ICD-10-CM

## 2012-08-10 HISTORY — DX: Other allergy status, other than to drugs and biological substances: Z91.09

## 2012-08-10 LAB — LIPID PANEL
HDL: 37 mg/dL — ABNORMAL LOW (ref >39)
LDL Cholesterol: 176 mg/dL — ABNORMAL HIGH (ref 0–99)
Triglycerides: 228 mg/dL — ABNORMAL HIGH (ref <150)

## 2012-08-10 LAB — COMPREHENSIVE METABOLIC PANEL
ALT: 30 U/L (ref 0–35)
AST: 17 U/L (ref 0–37)
CO2: 28 mEq/L (ref 19–32)
Calcium: 9.4 mg/dL (ref 8.4–10.5)
Chloride: 101 mEq/L (ref 96–112)
GFR calc Af Amer: >90 mL/min (ref >90)
GFR calc non Af Amer: >90 mL/min (ref >90)
Glucose, Bld: 119 mg/dL — ABNORMAL HIGH (ref 70–99)
Sodium: 137 mEq/L (ref 135–145)
Total Bilirubin: 0.2 mg/dL — ABNORMAL LOW (ref 0.3–1.2)

## 2012-08-10 MED ORDER — FLUTICASONE PROPIONATE 50 MCG/ACT NA SUSP: 2.0000 | Freq: Every day | NASAL | Status: DC

## 2012-08-10 MED ORDER — LORATADINE 10 MG PO TABS: 10.0000 mg | ORAL_TABLET | Freq: Every day | ORAL | Status: DC

## 2012-08-10 MED ORDER — CYCLOBENZAPRINE HCL 10 MG PO TABS: 10.0000 mg | ORAL_TABLET | Freq: Two times a day (BID) | ORAL | Status: DC | PRN

## 2012-08-10 NOTE — ED Provider Notes (Signed)
Medical screening examination/treatment/procedure(s) were performed by non-physician practitioner and as supervising physician I was immediately available for consultation/collaboration.  Raynald Blend, MD 08/10/12 1427

## 2012-08-10 NOTE — ED Provider Notes (Signed)
History     CSN: [id number]  Arrival date & time 08/10/12  1015   First MD Initiated Contact with Patient 08/10/12 1222      Chief Complaint  Patient presents with  . Medication Refill    (Consider location/radiation/quality/duration/timing/severity/associated sxs/prior treatment) HPI Comments: Pt here for refill meds and fasting labs.  [facility].  Has had problems with spring allergies for several years, uses flonase and claritin to manage but is out of both.  C/o sinus pressure, congestion, runny nose, clear drainage.  Flonase and claritin manage her sx well, requests refills.  Also requests refill of flexeril.  C/o chronic upper back pain between shoulder blades that is well managed by flexeril.  Uses approximately once per day but does not need to take every day.  Denies change in usual back or allergy sx.    The history is provided by the patient.    Past Medical History  Diagnosis Date  . Fibroid (bleeding) (uterine)   . Arthritis   . RLS (restless legs syndrome)   . Insomnia   . Anxiety   . Environmental allergies     Past Surgical History  Procedure Laterality Date  . Cholecystectomy    . Tubal ligation      Family History  Problem Relation Age of Onset  . Cancer Mother   . Diabetes Mother   . Hypertension Father   . Diabetes Sister   . Hyperlipidemia Sister     History  Substance Use Topics  . Smoking status: Current Every Day Smoker -- 0.25 packs/day for 30 years    Types: Cigarettes  . Smokeless tobacco: Never Used  . Alcohol Use: No    OB History   Grav Para Term Preterm Abortions TAB SAB Ect Mult Living                  Review of Systems  Constitutional: Negative for fever and chills.  HENT: Positive for congestion, rhinorrhea, postnasal drip and sinus pressure. Negative for ear pain and sore throat.   Respiratory: Negative for cough.   Musculoskeletal: Positive for back pain.  Neurological:  Negative for weakness and numbness.    Allergies  Review of patient's allergies indicates no known allergies.  Home Medications   Current Outpatient Rx  Name  Route  Sig  Dispense  Refill  . albuterol (PROVENTIL HFA;VENTOLIN HFA) 108 (90 BASE) MCG/ACT inhaler   Inhalation   Inhale 1-2 puffs into the lungs every 6 (six) hours as needed for wheezing.   1 Inhaler   0   . ASCORBIC ACID PO   Oral   Take 1 tablet by mouth daily.         . cholecalciferol (VITAMIN D) 1000 UNITS tablet   Oral   Take 1,000 Units by mouth daily.         . CYANOCOBALAMIN PO   Oral   Take 1 tablet by mouth daily.         . diclofenac (VOLTAREN) 75 MG EC tablet   Oral   Take 75 mg by mouth 2 (two) times daily.         Marland Kitchen co-enzyme Q-10 30 MG capsule   Oral   Take 30 mg by mouth daily.         . cyclobenzaprine (FLEXERIL) 10 MG tablet   Oral   Take 1 tablet (10 mg total) by mouth 2 (two) times daily as needed  for muscle spasms.   30 tablet   5   . fluticasone (FLONASE) 50 MCG/ACT nasal spray   Nasal   Place 2 sprays into the nose daily.   16 g   5   . HYDROcodone-acetaminophen (NORCO/VICODIN) 5-325 MG per tablet   Oral   Take 1 tablet by mouth every 6 (six) hours as needed.         . IRON PO   Oral   Take 1 tablet by mouth daily.         Marland Kitchen loratadine (CLARITIN) 10 MG tablet   Oral   Take 1 tablet (10 mg total) by mouth daily.   30 tablet   5   . medroxyPROGESTERone (PROVERA) 5 MG tablet   Oral   Take 1 tablet (5 mg total) by mouth daily.   30 tablet   3     BP 150/80  Pulse 85  Temp(Src) 97.7 F (36.5 C) (Oral)  Resp 20  SpO2 98%  LMP 08/08/2012  Physical Exam  Constitutional: She is oriented to person, place, and time. She appears well-developed and well-nourished. No distress.  obese  HENT:  Right Ear: A middle ear effusion is present.  Left Ear: A middle ear effusion is present.  Nose: Mucosal edema and rhinorrhea present. Right sinus exhibits no  maxillary sinus tenderness and no frontal sinus tenderness. Left sinus exhibits no maxillary sinus tenderness and no frontal sinus tenderness.  Mouth/Throat: Oropharynx is clear and moist.  Pulmonary/Chest: Effort normal.  Musculoskeletal:       Thoracic back: She exhibits tenderness. She exhibits normal range of motion, no bony tenderness, no swelling and no spasm.  B trapezius mild tender to palp  Lymphadenopathy:       Head (right side): No submental, no submandibular and no tonsillar adenopathy present.       Head (left side): No submental, no submandibular and no tonsillar adenopathy present.  Neurological: She is alert and oriented to person, place, and time. She has normal strength. No sensory deficit. Gait normal.  Reflex Scores:      Bicep reflexes are 2+ on the right side and 2+ on the left side.      Patellar reflexes are 2+ on the right side and 2+ on the left side.   ED Course  Procedures (including critical care time)  Labs Reviewed  LIPID PANEL  COMPREHENSIVE METABOLIC PANEL  TSH   No results found.   1. Allergic rhinitis   2. Back pain, chronic   3. Screening cholesterol level   4. Thyroid disorder screening   5. Obesity       MDM  Labs cmet, lipids, tsh drawn. Pt to rtc in 6 months for refills, routine f/u or sooner if lab results warrant or if pt needs.         Cathlyn Parsons, NP 08/10/12 1239

## 2012-08-10 NOTE — ED Notes (Signed)
Pt is asking for refills of flonase and flexeril.  She reports seasonal allergy Sxs today

## 2012-08-17 ENCOUNTER — Telehealth (HOSPITAL_COMMUNITY): Payer: Self-pay

## 2012-08-17 DIAGNOSIS — G8929 Other chronic pain: Secondary | ICD-10-CM | POA: Diagnosis present

## 2012-08-17 DIAGNOSIS — J309 Allergic rhinitis, unspecified: Secondary | ICD-10-CM | POA: Diagnosis present

## 2012-08-17 DIAGNOSIS — E785 Hyperlipidemia, unspecified: Secondary | ICD-10-CM | POA: Insufficient documentation

## 2012-08-17 MED ORDER — PRAVASTATIN SODIUM 40 MG PO TABS: 40.0000 mg | ORAL_TABLET | Freq: Every day | ORAL | Status: DC

## 2012-08-17 NOTE — ED Notes (Signed)
Patient received lab results Pravastatin 40mg   1 po daily #30 with  3 refills escribed to [facility]

## 2012-08-19 ENCOUNTER — Telehealth (HOSPITAL_COMMUNITY): Payer: Self-pay | Admitting: *Deleted

## 2012-08-19 NOTE — ED Notes (Signed)
Dr. Ladon Applebaum said to call pt., and tell her to f/u with a PCP for hyperlipidemia. If pt., does not have a PCP then provide referrals.  I called pt. Aand left a message to call. [name] 08/19/2012

## 2012-08-27 ENCOUNTER — Telehealth (HOSPITAL_COMMUNITY): Payer: Self-pay | Admitting: *Deleted

## 2012-08-29 ENCOUNTER — Telehealth (HOSPITAL_COMMUNITY): Payer: Self-pay | Admitting: *Deleted

## 2012-08-29 NOTE — ED Notes (Signed)
Pt. Notified that labs showed an elevated cholesterol and Dr. Ladon Applebaum wanted her to f/u with her PCP. Pt. said she got Rx. Pravastatin [facility] and is to f/u in 3 mos. I told pt. that was good. I told her I did not see that f/u until today. [name] 08/29/2012

## 2013-01-25 ENCOUNTER — Telehealth (HOSPITAL_COMMUNITY): Payer: Self-pay | Admitting: *Deleted

## 2013-01-25 NOTE — ED Notes (Signed)
Message from Dr. Ladon Applebaum to tell pt. to f/u with her PCP for the lipid panel. I called and left a message to call. Catherine Nielsen 01/25/2013

## 2013-01-30 ENCOUNTER — Telehealth (HOSPITAL_COMMUNITY): Payer: Self-pay | Admitting: *Deleted

## 2013-01-30 NOTE — ED Notes (Addendum)
Reviewed notes and saw that I had already contacted pt. and she had f[facility]. Pt. called back. I told her to disregard call because, when I reviewed her notes I saw that I had already talked to her about f/u for her lab results on 4/9.

## 2013-07-10 ENCOUNTER — Emergency Department (HOSPITAL_COMMUNITY): Payer: No Typology Code available for payment source

## 2013-07-10 ENCOUNTER — Emergency Department (HOSPITAL_COMMUNITY)
Admission: EM | Admit: 2013-07-10 | Discharge: 2013-07-10 | Disposition: A | Discharge status: Home / Self Care | Payer: No Typology Code available for payment source | Attending: Emergency Medicine | Admitting: Emergency Medicine

## 2013-07-10 ENCOUNTER — Encounter (HOSPITAL_COMMUNITY): Payer: Self-pay | Admitting: Emergency Medicine

## 2013-07-10 DIAGNOSIS — W19XXXA Unspecified fall, initial encounter: Secondary | ICD-10-CM

## 2013-07-10 DIAGNOSIS — F17200 Nicotine dependence, unspecified, uncomplicated: Secondary | ICD-10-CM | POA: Insufficient documentation

## 2013-07-10 DIAGNOSIS — IMO0002 Reserved for concepts with insufficient information to code with codable children: Secondary | ICD-10-CM | POA: Insufficient documentation

## 2013-07-10 DIAGNOSIS — Z8742 Personal history of other diseases of the female genital tract: Secondary | ICD-10-CM | POA: Insufficient documentation

## 2013-07-10 DIAGNOSIS — M129 Arthropathy, unspecified: Secondary | ICD-10-CM | POA: Insufficient documentation

## 2013-07-10 DIAGNOSIS — R269 Unspecified abnormalities of gait and mobility: Secondary | ICD-10-CM | POA: Insufficient documentation

## 2013-07-10 DIAGNOSIS — Z8669 Personal history of other diseases of the nervous system and sense organs: Secondary | ICD-10-CM | POA: Insufficient documentation

## 2013-07-10 DIAGNOSIS — Y9389 Activity, other specified: Secondary | ICD-10-CM | POA: Insufficient documentation

## 2013-07-10 DIAGNOSIS — W010XXA Fall on same level from slipping, tripping and stumbling without subsequent striking against object, initial encounter: Secondary | ICD-10-CM | POA: Insufficient documentation

## 2013-07-10 DIAGNOSIS — Z8659 Personal history of other mental and behavioral disorders: Secondary | ICD-10-CM | POA: Insufficient documentation

## 2013-07-10 DIAGNOSIS — Y9289 Other specified places as the place of occurrence of the external cause: Secondary | ICD-10-CM | POA: Insufficient documentation

## 2013-07-10 DIAGNOSIS — S82402A Unspecified fracture of shaft of left fibula, initial encounter for closed fracture: Secondary | ICD-10-CM

## 2013-07-10 DIAGNOSIS — Z79899 Other long term (current) drug therapy: Secondary | ICD-10-CM | POA: Insufficient documentation

## 2013-07-10 DIAGNOSIS — S82409A Unspecified fracture of shaft of unspecified fibula, initial encounter for closed fracture: Secondary | ICD-10-CM | POA: Insufficient documentation

## 2013-07-10 HISTORY — DX: Unspecified fall, initial encounter: W19.XXXA

## 2013-07-10 MED ORDER — HYDROCODONE-ACETAMINOPHEN 5-325 MG PO TABS: 1.0000 | ORAL_TABLET | Freq: Four times a day (QID) | ORAL | Status: DC | PRN

## 2013-07-10 MED ORDER — NAPROXEN 500 MG PO TABS: 500.0000 mg | ORAL_TABLET | Freq: Two times a day (BID) | ORAL | Status: DC

## 2013-07-10 NOTE — ED Provider Notes (Signed)
CSN: [id number]     Arrival date & time 07/10/13  1528 History  This chart was scribed for non-physician practitioner, Margarita Mail, PA-C working with Arbie Cookey,  by Einar Pheasant, ED scribe. This patient was seen in room TR09C/TR09C and the patient's care was started at 6:48 PM .    Chief Complaint  Patient presents with  . Leg Pain   The history is provided by the patient. No language interpreter was used.  HPI Comments: Catherine Nielsen is a 58 y.o. female who presents to the Emergency Department complaining of a fall that occurred this morning. Pt states that she was stepping off the sidewalk onto the parking lot when she slipped on ice and fell. She states that she felt a pop and heard crunching in her left leg. Pt reports taking 2 Tylenols at 1 pm today. Denies any fever, chills, cough, SOB or diaphoresis.     Past Medical History  Diagnosis Date  . Fibroid (bleeding) (uterine)   . Arthritis   . RLS (restless legs syndrome)   . Insomnia   . Anxiety   . Environmental allergies    Past Surgical History  Procedure Laterality Date  . Cholecystectomy    . Tubal ligation     Family History  Problem Relation Age of Onset  . Cancer Mother   . Diabetes Mother   . Hypertension Father   . Diabetes Sister   . Hyperlipidemia Sister    History  Substance Use Topics  . Smoking status: Current Every Day Smoker -- 0.25 packs/day for 30 years    Types: Cigarettes  . Smokeless tobacco: Never Used  . Alcohol Use: No   OB History   Grav Para Term Preterm Abortions TAB SAB Ect Mult Living                 Review of Systems  Constitutional: Negative for fever, chills and diaphoresis.  Respiratory: Negative for cough and shortness of breath.   Musculoskeletal: Positive for arthralgias, gait problem and joint swelling.  Skin: Negative for wound.  Neurological: Negative for syncope and numbness.  Hematological: Does not bruise/bleed easily.      Allergies  Review of  patient's allergies indicates no known allergies.  Home Medications   Current Outpatient Rx  Name  Route  Sig  Dispense  Refill  . albuterol (PROVENTIL HFA;VENTOLIN HFA) 108 (90 BASE) MCG/ACT inhaler   Inhalation   Inhale 1-2 puffs into the lungs every 6 (six) hours as needed for wheezing.   1 Inhaler   0   . cholecalciferol (VITAMIN D) 1000 UNITS tablet   Oral   Take 1,000 Units by mouth daily.         . diclofenac (VOLTAREN) 75 MG EC tablet   Oral   Take 75 mg by mouth 2 (two) times daily as needed for mild pain.          . diphenhydramine-acetaminophen (TYLENOL PM) 25-500 MG TABS   Oral   Take 2 tablets by mouth daily as needed (sleep).         . fluticasone (FLONASE) 50 MCG/ACT nasal spray   Nasal   Place 2 sprays into the nose daily.   16 g   5   . ibuprofen (ADVIL,MOTRIN) 200 MG tablet   Oral   Take 400 mg by mouth every 6 (six) hours as needed for moderate pain.         Marland Kitchen OVER THE COUNTER MEDICATION  Oral   Take 1 tablet by mouth daily as needed (allergies). "contacts"          Triage vitals: BP 156/84  Pulse 103  Temp(Src) 98.1 F (36.7 C) (Oral)  Resp 17  Wt 250 lb (113.399 kg)  SpO2 94%  Physical Exam  Nursing note and vitals reviewed. Constitutional: She is oriented to person, place, and time. She appears well-developed and well-nourished. No distress.  HENT:  Head: Normocephalic and atraumatic.  Eyes: EOM are normal.  Neck: Neck supple. No tracheal deviation present.  Cardiovascular: Normal rate.   Pulmonary/Chest: Effort normal. No respiratory distress.  Musculoskeletal: Normal range of motion.  Swelling over the middle to distal L LE on the lateral side.  N/v intact. Able to flex/ext ankle . More pain with inversion and eversion.  No wounds. Unable to bear weight  Neurological: She is alert and oriented to person, place, and time.  Skin: Skin is warm and dry.  Psychiatric: She has a normal mood and affect. Her behavior is normal.     ED Course  Procedures (including critical care time)  DIAGNOSTIC STUDIES: Oxygen Saturation is 94% on RA, low by my interpretation.    COORDINATION OF CARE: 7:03 PM- Will order a splint for the pt. Advised pt to follow up with an orthopedic physician. Will order pain medication and antiinflammatory medication. Pt advised of plan for treatment and pt agrees.  Labs Review Labs Reviewed - No data to display Imaging Review Dg Ankle Complete Left  07/10/2013   CLINICAL DATA:  Fall.  EXAM: LEFT ANKLE COMPLETE - 3+ VIEW  FINDINGS: Spiral fracture of the distal left fibular shaft is noted. Fracture slightly displaced. Medial malleolus intact. Disuse soft tissue swelling. No foreign body.  IMPRESSION: Spiral displaced fracture of the distal left fibular shaft.   Electronically Signed   By: Marcello Moores  Register   On: 07/10/2013 16:37    EKG Interpretation   None       MDM   Final diagnoses:  Left fibular fracture    Patient with spiral fracture of the distal fibula. Mildly displaced but in good alignment. I have place the patient in posterior and stirrup splint. She is to be NWB until she is evaluated by an orthopedist.   I personally performed the services described in this documentation, which was scribed in my presence. The recorded information has been reviewed and is accurate. At this time there all  emergency medical conditions have been stabilized and the patient appears ready for discharge with appropriate outpatient follow up.Diagnosis was discussed with patient who verbalizes understanding and is agreeable to discharge and plan ov care. Return precautions discussed.   Margarita Mail, PA-C 07/12/13 1301

## 2013-07-10 NOTE — Progress Notes (Signed)
Orthopedic Tech Progress Note Patient Details:  Catherine Nielsen [birth year] [id number]  Ortho Devices Type of Ortho Device: Ace wrap;Crutches;Post (short leg) splint;Stirrup splint Ortho Device/Splint Location: lle Ortho Device/Splint Interventions: Application   Killian Schwer 07/10/2013, 7:35 PM

## 2013-07-10 NOTE — ED Notes (Signed)
Ortho called to come apply splint  

## 2013-07-10 NOTE — ED Notes (Signed)
To ED for eval of left ankle/lower leg pain since slipping on ice this am and falling. Pt states she felt a pop and heard crunching. Pulses palpable. No obvious deformity noted. Pt states she took 2 Tylenol PM's at 1pm today.

## 2013-07-10 NOTE — Discharge Instructions (Signed)
Do not bear weight on this leg. Please follow up with the orthopedic doctor this week.  Fibular Fracture, Ankle, Adult, Treated With or Without Immobilization A fibular fracture at your ankle is a break (fracture) bone in the smallest of the two bones in your lower leg, located on the outside of your leg (fibula) close to the area at your ankle joint. CAUSES  Rolling your ankle.  Twisting your ankle.  Extreme flexing or extending of your foot.  Severe force on your ankle as when falling from a distance. RISK FACTORS  Jumping activities.  Participation in sports.  Osteoporosis.  Advanced age.  Previous ankle injuries. SIGNS AND SYMPTOMS  Pain.  Swelling.  Inability to put weight on injured ankle.  Bruising.  Bone deformities at site of injury. DIAGNOSIS  This fracture is diagnosed with the help of an X-ray exam. TREATMENT  If the fractured bone did not move out of place it usually will heal without problems and does casting or splinting. If immobilization is needed for comfort or the fractured bone moved out of place and will not heal properly with immobilization, a cast or splint will be used. HOME CARE INSTRUCTIONS   Apply ice to the area of injury:  Put ice in a plastic bag.  Place a towel between your skin and the bag.  Leave the ice on for 20 minutes, 2 3 times a day.  Use crutches as directed. Resume walking without crutches as directed by your health care provider.  Only take over-the-counter or prescription medicines for pain, discomfort, or fever as directed by your health care provider.  If you have a removable splint or boot, do not remove the boot unless directed by your health care provider. SEEK MEDICAL CARE IF:   You have continued pain or more swelling  The medications do not control the pain. SEEK IMMEDIATE MEDICAL CARE IF:  You develop severe pain in the leg or foot.  Your skin or nails below the injury turn blue or grey or feel cold or  numb. MAKE SURE YOU:   Understand these instructions.  Will watch your condition.  Will get help right away if you are not doing well or get worse. Document Released: 05/09/2005 Document Revised: 02/27/2013 Document Reviewed: 12/19/2012 [facility] Patient Information 2014 [place].

## 2013-07-11 ENCOUNTER — Other Ambulatory Visit (HOSPITAL_COMMUNITY): Payer: Self-pay | Admitting: Orthopaedic Surgery

## 2013-07-14 NOTE — ED Provider Notes (Signed)
Medical screening examination/treatment/procedure(s) were performed by non-physician practitioner and as supervising physician I was immediately available for consultation/collaboration.   Houston Siren III, MD 07/14/13 1556

## 2013-07-16 ENCOUNTER — Encounter (HOSPITAL_COMMUNITY): Payer: Self-pay

## 2013-07-16 ENCOUNTER — Encounter (HOSPITAL_COMMUNITY): Payer: Self-pay | Admitting: Pharmacy Technician

## 2013-07-16 ENCOUNTER — Encounter (HOSPITAL_COMMUNITY)
Admission: RE | Admit: 2013-07-16 | Discharge: 2013-07-16 | Disposition: A | Discharge status: Home / Self Care | Payer: No Typology Code available for payment source | Source: Ambulatory Visit | Attending: Orthopaedic Surgery | Admitting: Orthopaedic Surgery

## 2013-07-16 DIAGNOSIS — Z01812 Encounter for preprocedural laboratory examination: Secondary | ICD-10-CM | POA: Insufficient documentation

## 2013-07-16 HISTORY — DX: Unspecified fall, initial encounter: W19.XXXA

## 2013-07-16 HISTORY — DX: Gastro-esophageal reflux disease without esophagitis: K21.9

## 2013-07-16 HISTORY — DX: Personal history of other infectious and parasitic diseases: Z86.19

## 2013-07-16 LAB — CBC
HCT: 38.1 % (ref 36.0–46.0)
HEMOGLOBIN: 12.6 g/dL (ref 12.0–15.0)
MCH: 29.1 pg (ref 26.0–34.0)
MCHC: 33.1 g/dL (ref 30.0–36.0)
MCV: 88 fL (ref 78.0–100.0)
Platelets: 385 10*3/uL (ref 150–400)
RBC: 4.33 MIL/uL (ref 3.87–5.11)
RDW: 12.4 % (ref 11.5–15.5)
WBC: 4.1 10*3/uL (ref 4.0–10.5)

## 2013-07-16 LAB — BASIC METABOLIC PANEL
BUN: 11 mg/dL (ref 6–23)
CO2: 29 meq/L (ref 19–32)
Calcium: 9.9 mg/dL (ref 8.4–10.5)
Chloride: 99 mEq/L (ref 96–112)
Creatinine, Ser: 0.64 mg/dL (ref 0.50–1.10)
GFR calc Af Amer: >90 mL/min (ref >90)
GFR calc non Af Amer: >90 mL/min (ref >90)
GLUCOSE: 161 mg/dL — AB (ref 70–99)
POTASSIUM: 4.6 meq/L (ref 3.7–5.3)
SODIUM: 138 meq/L (ref 137–147)

## 2013-07-16 LAB — HCG, SERUM, QUALITATIVE: PREG SERUM: NEGATIVE

## 2013-07-16 NOTE — Patient Instructions (Addendum)
   YOUR SURGERY IS SCHEDULED AT [facility]  ON:  Wednesday  2/25  REPORT TO  SHORT STAY CENTER AT:  2:45 PM      PHONE # FOR SHORT STAY IS [phone]  DO NOT EAT  ANYTHING AFTER MIDNIGHT THE NIGHT BEFORE YOUR SURGERY.  YOU MAY BRUSH YOUR TEETH,   NO FOOD, NO CHEWING GUM, NO MINTS, NO CANDIES, NO CHEWING TOBACCO. YOU MAY HAVE CLEAR LIQUIDS TO DRINK FROM MIDNIGHT UNTIL 11:15 AM DAY OF SURGERY - LIKE WATER,  TEA, SODA, APPLE, GRAPE OR CRANBERRY JUICE.                                       NOTHING TO DRINK AFTER 11:15 AM THE DAY OF SURGERY.  PLEASE TAKE THE FOLLOWING MEDICATIONS THE AM OF YOUR SURGERY WITH A FEW SIPS OF WATER:  HYDROCODONE/ACETAMINOPHEN    USE YOUR ALBUTEROL INHALER AND BRINK INHALER.    DO NOT BRING VALUABLES, MONEY, CREDIT CARDS.  DO NOT WEAR JEWELRY, MAKE-UP, NAIL POLISH AND NO METAL PINS OR CLIPS IN YOUR HAIR. CONTACT LENS, DENTURES / PARTIALS, GLASSES SHOULD NOT BE WORN TO SURGERY AND IN MOST CASES-HEARING AIDS WILL NEED TO BE REMOVED.  BRING YOUR GLASSES CASE, ANY EQUIPMENT NEEDED FOR YOUR CONTACT LENS. FOR PATIENTS ADMITTED TO THE HOSPITAL--CHECK OUT TIME THE DAY OF DISCHARGE IS 11:00 AM.  ALL INPATIENT ROOMS ARE PRIVATE - WITH BATHROOM, TELEPHONE, TELEVISION AND WIFI INTERNET.                                                    PLEASE READ OVER ANY  FACT SHEETS THAT YOU WERE GIVEN:  INCENTIVE SPIROMETER INFORMATION.  FAILURE TO FOLLOW THESE INSTRUCTIONS MAY RESULT IN THE CANCELLATION OF YOUR SURGERY. PLEASE BE AWARE THAT YOU MAY NEED ADDITIONAL BLOOD DRAWN DAY OF YOUR SURGERY  PATIENT SIGNATURE_________________________________

## 2013-07-16 NOTE — Pre-Procedure Instructions (Signed)
CXR AND EKG NOT NEEDED PREOP PER ANESTHESIOLOGIST'S GUIDELINES.

## 2013-07-17 ENCOUNTER — Observation Stay (HOSPITAL_COMMUNITY)
Admission: RE | Admit: 2013-07-17 | Discharge: 2013-07-19 | Disposition: A | Discharge status: Home / Self Care | Payer: No Typology Code available for payment source | Source: Ambulatory Visit | Attending: Orthopaedic Surgery | Admitting: Orthopaedic Surgery

## 2013-07-17 ENCOUNTER — Encounter (HOSPITAL_COMMUNITY): Payer: No Typology Code available for payment source | Admitting: Anesthesiology

## 2013-07-17 ENCOUNTER — Inpatient Hospital Stay (HOSPITAL_COMMUNITY): Payer: No Typology Code available for payment source | Admitting: Anesthesiology

## 2013-07-17 ENCOUNTER — Encounter (HOSPITAL_COMMUNITY)
Admission: RE | Disposition: A | Discharge status: Home / Self Care | Payer: Self-pay | Source: Ambulatory Visit | Attending: Orthopaedic Surgery

## 2013-07-17 ENCOUNTER — Observation Stay (HOSPITAL_COMMUNITY): Payer: No Typology Code available for payment source

## 2013-07-17 ENCOUNTER — Encounter (HOSPITAL_COMMUNITY): Payer: Self-pay | Admitting: *Deleted

## 2013-07-17 DIAGNOSIS — S8262XA Displaced fracture of lateral malleolus of left fibula, initial encounter for closed fracture: Secondary | ICD-10-CM | POA: Diagnosis present

## 2013-07-17 DIAGNOSIS — F329 Major depressive disorder, single episode, unspecified: Secondary | ICD-10-CM | POA: Insufficient documentation

## 2013-07-17 DIAGNOSIS — K219 Gastro-esophageal reflux disease without esophagitis: Secondary | ICD-10-CM | POA: Insufficient documentation

## 2013-07-17 DIAGNOSIS — W010XXA Fall on same level from slipping, tripping and stumbling without subsequent striking against object, initial encounter: Secondary | ICD-10-CM | POA: Insufficient documentation

## 2013-07-17 DIAGNOSIS — G4700 Insomnia, unspecified: Secondary | ICD-10-CM | POA: Insufficient documentation

## 2013-07-17 DIAGNOSIS — D259 Leiomyoma of uterus, unspecified: Secondary | ICD-10-CM | POA: Insufficient documentation

## 2013-07-17 DIAGNOSIS — S8263XA Displaced fracture of lateral malleolus of unspecified fibula, initial encounter for closed fracture: Principal | ICD-10-CM | POA: Insufficient documentation

## 2013-07-17 DIAGNOSIS — F3289 Other specified depressive episodes: Secondary | ICD-10-CM | POA: Insufficient documentation

## 2013-07-17 DIAGNOSIS — F411 Generalized anxiety disorder: Secondary | ICD-10-CM | POA: Insufficient documentation

## 2013-07-17 DIAGNOSIS — G2581 Restless legs syndrome: Secondary | ICD-10-CM | POA: Insufficient documentation

## 2013-07-17 DIAGNOSIS — F17200 Nicotine dependence, unspecified, uncomplicated: Secondary | ICD-10-CM | POA: Insufficient documentation

## 2013-07-17 HISTORY — DX: Headache: R51

## 2013-07-17 HISTORY — DX: Other chronic pain: G89.29

## 2013-07-17 HISTORY — DX: Dorsalgia, unspecified: M54.9

## 2013-07-17 HISTORY — DX: Anemia, unspecified: D64.9

## 2013-07-17 HISTORY — PX: ORIF ANKLE FRACTURE: SHX5408

## 2013-07-17 HISTORY — DX: Depression, unspecified: F32.A

## 2013-07-17 HISTORY — DX: Unspecified chronic bronchitis: J42

## 2013-07-17 HISTORY — DX: Headache, unspecified: R51.9

## 2013-07-17 HISTORY — PX: ORIF ANKLE FRACTURE: SUR919

## 2013-07-17 HISTORY — DX: Sciatica, unspecified side: M54.30

## 2013-07-17 HISTORY — DX: Major depressive disorder, single episode, unspecified: F32.9

## 2013-07-17 SURGERY — OPEN REDUCTION INTERNAL FIXATION (ORIF) ANKLE FRACTURE
Anesthesia: Choice | Site: Ankle | Laterality: Left

## 2013-07-17 SURGERY — OPEN REDUCTION INTERNAL FIXATION (ORIF) ANKLE FRACTURE
Anesthesia: General | Site: Ankle | Laterality: Left

## 2013-07-17 MED ORDER — FENTANYL CITRATE 0.05 MG/ML IJ SOLN
INTRAMUSCULAR | Status: AC
  Filled 2013-07-17: qty 5

## 2013-07-17 MED ORDER — ROCURONIUM BROMIDE 100 MG/10ML IV SOLN
INTRAVENOUS | Status: DC | PRN
  Administered 2013-07-17: 40 mg via INTRAVENOUS

## 2013-07-17 MED ORDER — HYDROCODONE-ACETAMINOPHEN 5-325 MG PO TABS
1.0000 | ORAL_TABLET | ORAL | Status: DC | PRN
  Administered 2013-07-18 – 2013-07-19 (×4): 2 via ORAL
  Filled 2013-07-17 (×5): qty 2

## 2013-07-17 MED ORDER — MIDAZOLAM HCL 2 MG/2ML IJ SOLN
2.0000 mg | Freq: Once | INTRAMUSCULAR | Status: DC
  Filled 2013-07-17: qty 2

## 2013-07-17 MED ORDER — ONDANSETRON HCL 4 MG/2ML IJ SOLN
INTRAMUSCULAR | Status: AC
  Filled 2013-07-17: qty 2

## 2013-07-17 MED ORDER — SODIUM CHLORIDE 0.9 % IV SOLN
INTRAVENOUS | Status: DC
  Administered 2013-07-17: 23:00:00 via INTRAVENOUS

## 2013-07-17 MED ORDER — FENTANYL CITRATE 0.05 MG/ML IJ SOLN
100.0000 ug | Freq: Once | INTRAMUSCULAR | Status: AC
  Administered 2013-07-17: 100 ug via INTRAVENOUS

## 2013-07-17 MED ORDER — METOCLOPRAMIDE HCL 5 MG/ML IJ SOLN: 5.0000 mg | Freq: Three times a day (TID) | INTRAMUSCULAR | Status: DC | PRN

## 2013-07-17 MED ORDER — ONDANSETRON HCL 4 MG/2ML IJ SOLN
4.0000 mg | Freq: Four times a day (QID) | INTRAMUSCULAR | Status: DC | PRN
  Administered 2013-07-18: 4 mg via INTRAVENOUS
  Filled 2013-07-17: qty 2

## 2013-07-17 MED ORDER — OXYCODONE HCL 5 MG PO TABS: 5.0000 mg | ORAL_TABLET | ORAL | Status: DC | PRN

## 2013-07-17 MED ORDER — PHENYLEPHRINE HCL 10 MG/ML IJ SOLN
INTRAMUSCULAR | Status: DC | PRN
  Administered 2013-07-17: 80 ug via INTRAVENOUS
  Administered 2013-07-17: 40 ug via INTRAVENOUS
  Administered 2013-07-17: 80 ug via INTRAVENOUS

## 2013-07-17 MED ORDER — SORBITOL 70 % SOLN: 30.0000 mL | Freq: Every day | Status: DC | PRN

## 2013-07-17 MED ORDER — FENTANYL CITRATE 0.05 MG/ML IJ SOLN
INTRAMUSCULAR | Status: AC
  Administered 2013-07-17: 50 ug
  Filled 2013-07-17: qty 2

## 2013-07-17 MED ORDER — KETOROLAC TROMETHAMINE 30 MG/ML IJ SOLN
30.0000 mg | Freq: Four times a day (QID) | INTRAMUSCULAR | Status: DC | PRN
  Filled 2013-07-17: qty 1

## 2013-07-17 MED ORDER — SUCCINYLCHOLINE CHLORIDE 20 MG/ML IJ SOLN
INTRAMUSCULAR | Status: AC
  Filled 2013-07-17: qty 1

## 2013-07-17 MED ORDER — CHLORHEXIDINE GLUCONATE 4 % EX LIQD
60.0000 mL | Freq: Once | CUTANEOUS | Status: DC
  Filled 2013-07-17: qty 60

## 2013-07-17 MED ORDER — MAGNESIUM CITRATE PO SOLN: 1.0000 | Freq: Once | ORAL | Status: AC | PRN

## 2013-07-17 MED ORDER — LIDOCAINE HCL (CARDIAC) 20 MG/ML IV SOLN
INTRAVENOUS | Status: DC | PRN
  Administered 2013-07-17: 60 mg via INTRAVENOUS

## 2013-07-17 MED ORDER — NEOSTIGMINE METHYLSULFATE 1 MG/ML IJ SOLN
INTRAMUSCULAR | Status: DC | PRN
  Administered 2013-07-17: 3 mg via INTRAVENOUS

## 2013-07-17 MED ORDER — DIPHENHYDRAMINE HCL 12.5 MG/5ML PO ELIX: 25.0000 mg | ORAL_SOLUTION | ORAL | Status: DC | PRN

## 2013-07-17 MED ORDER — ONDANSETRON HCL 4 MG PO TABS
4.0000 mg | ORAL_TABLET | Freq: Four times a day (QID) | ORAL | Status: DC | PRN
  Administered 2013-07-18 – 2013-07-19 (×2): 4 mg via ORAL
  Filled 2013-07-17 (×2): qty 1

## 2013-07-17 MED ORDER — VITAMIN D3 25 MCG (1000 UNIT) PO TABS
1000.0000 [IU] | ORAL_TABLET | Freq: Every day | ORAL | Status: DC
  Administered 2013-07-18 – 2013-07-19 (×2): 1000 [IU] via ORAL
  Filled 2013-07-17 (×3): qty 1

## 2013-07-17 MED ORDER — ROCURONIUM BROMIDE 50 MG/5ML IV SOLN
INTRAVENOUS | Status: AC
  Filled 2013-07-17: qty 1

## 2013-07-17 MED ORDER — OXYCODONE HCL 5 MG PO TABS
5.0000 mg | ORAL_TABLET | ORAL | Status: DC | PRN
  Administered 2013-07-17 – 2013-07-18 (×4): 15 mg via ORAL
  Filled 2013-07-17 (×4): qty 3

## 2013-07-17 MED ORDER — ASPIRIN EC 325 MG PO TBEC
325.0000 mg | DELAYED_RELEASE_TABLET | Freq: Two times a day (BID) | ORAL | Status: DC
  Administered 2013-07-17 – 2013-07-19 (×4): 325 mg via ORAL
  Filled 2013-07-17 (×6): qty 1

## 2013-07-17 MED ORDER — LIDOCAINE HCL (CARDIAC) 20 MG/ML IV SOLN
INTRAVENOUS | Status: AC
  Filled 2013-07-17: qty 5

## 2013-07-17 MED ORDER — KETOROLAC TROMETHAMINE 30 MG/ML IJ SOLN
INTRAMUSCULAR | Status: AC
  Administered 2013-07-17: 15 mg
  Filled 2013-07-17: qty 1

## 2013-07-17 MED ORDER — ONDANSETRON HCL 4 MG/2ML IJ SOLN
INTRAMUSCULAR | Status: DC | PRN
  Administered 2013-07-17: 4 mg via INTRAVENOUS

## 2013-07-17 MED ORDER — CEFAZOLIN SODIUM-DEXTROSE 2-3 GM-% IV SOLR
INTRAVENOUS | Status: AC
  Filled 2013-07-17: qty 50

## 2013-07-17 MED ORDER — METOCLOPRAMIDE HCL 5 MG PO TABS
5.0000 mg | ORAL_TABLET | Freq: Three times a day (TID) | ORAL | Status: DC | PRN
  Filled 2013-07-17: qty 2

## 2013-07-17 MED ORDER — FENTANYL CITRATE 0.05 MG/ML IJ SOLN
INTRAMUSCULAR | Status: AC
  Administered 2013-07-17: 25 ug
  Filled 2013-07-17: qty 2

## 2013-07-17 MED ORDER — LORATADINE 10 MG PO TABS
10.0000 mg | ORAL_TABLET | Freq: Every day | ORAL | Status: DC
  Administered 2013-07-18 – 2013-07-19 (×2): 10 mg via ORAL
  Filled 2013-07-17 (×3): qty 1

## 2013-07-17 MED ORDER — LACTATED RINGERS IV SOLN
INTRAVENOUS | Status: DC | PRN
  Administered 2013-07-17 (×2): via INTRAVENOUS

## 2013-07-17 MED ORDER — 0.9 % SODIUM CHLORIDE (POUR BTL) OPTIME
TOPICAL | Status: DC | PRN
  Administered 2013-07-17: 1000 mL

## 2013-07-17 MED ORDER — METOCLOPRAMIDE HCL 5 MG/ML IJ SOLN
INTRAMUSCULAR | Status: AC
Start: 2013-07-17 — End: 2013-07-18
  Filled 2013-07-17: qty 2

## 2013-07-17 MED ORDER — HYDROMORPHONE HCL PF 1 MG/ML IJ SOLN
0.2500 mg | INTRAMUSCULAR | Status: DC | PRN
  Administered 2013-07-17: 0.5 mg via INTRAVENOUS

## 2013-07-17 MED ORDER — SENNA 8.6 MG PO TABS
1.0000 | ORAL_TABLET | Freq: Two times a day (BID) | ORAL | Status: DC
  Administered 2013-07-17 – 2013-07-19 (×4): 8.6 mg via ORAL
  Filled 2013-07-17 (×5): qty 1

## 2013-07-17 MED ORDER — PHENYLEPHRINE 40 MCG/ML (10ML) SYRINGE FOR IV PUSH (FOR BLOOD PRESSURE SUPPORT)
PREFILLED_SYRINGE | INTRAVENOUS | Status: AC
  Filled 2013-07-17: qty 10

## 2013-07-17 MED ORDER — GLYCOPYRROLATE 0.2 MG/ML IJ SOLN
INTRAMUSCULAR | Status: DC | PRN
  Administered 2013-07-17: .3 mg via INTRAVENOUS

## 2013-07-17 MED ORDER — ALBUTEROL SULFATE (2.5 MG/3ML) 0.083% IN NEBU: 3.0000 mL | INHALATION_SOLUTION | Freq: Four times a day (QID) | RESPIRATORY_TRACT | Status: DC | PRN

## 2013-07-17 MED ORDER — HYDROMORPHONE HCL PF 1 MG/ML IJ SOLN
INTRAMUSCULAR | Status: AC
  Filled 2013-07-17: qty 1

## 2013-07-17 MED ORDER — DEXTROSE 5 % IV SOLN
3.0000 g | Freq: Four times a day (QID) | INTRAVENOUS | Status: AC
  Administered 2013-07-18 (×2): 3 g via INTRAVENOUS
  Filled 2013-07-17 (×3): qty 3000

## 2013-07-17 MED ORDER — PROPOFOL 10 MG/ML IV BOLUS
INTRAVENOUS | Status: AC
  Filled 2013-07-17: qty 20

## 2013-07-17 MED ORDER — CEFAZOLIN SODIUM-DEXTROSE 2-3 GM-% IV SOLR
INTRAVENOUS | Status: DC | PRN
  Administered 2013-07-17: 2 g via INTRAVENOUS

## 2013-07-17 MED ORDER — FENTANYL CITRATE 0.05 MG/ML IJ SOLN
INTRAMUSCULAR | Status: DC | PRN
  Administered 2013-07-17: 50 ug via INTRAVENOUS
  Administered 2013-07-17: 150 ug via INTRAVENOUS
  Administered 2013-07-17: 100 ug via INTRAVENOUS

## 2013-07-17 MED ORDER — POLYETHYLENE GLYCOL 3350 17 G PO PACK: 17.0000 g | PACK | Freq: Every day | ORAL | Status: DC | PRN

## 2013-07-17 MED ORDER — CEFAZOLIN SODIUM-DEXTROSE 2-3 GM-% IV SOLR: 2.0000 g | INTRAVENOUS | Status: DC

## 2013-07-17 MED ORDER — METOCLOPRAMIDE HCL 5 MG/ML IJ SOLN
10.0000 mg | Freq: Once | INTRAMUSCULAR | Status: AC
  Administered 2013-07-17: 10 mg via INTRAVENOUS

## 2013-07-17 MED ORDER — FLUTICASONE PROPIONATE 50 MCG/ACT NA SUSP
2.0000 | Freq: Every day | NASAL | Status: DC
  Administered 2013-07-18 – 2013-07-19 (×2): 2 via NASAL
  Filled 2013-07-17: qty 16

## 2013-07-17 MED ORDER — BUPIVACAINE HCL (PF) 0.25 % IJ SOLN
INTRAMUSCULAR | Status: AC
  Filled 2013-07-17: qty 30

## 2013-07-17 MED ORDER — MORPHINE SULFATE 2 MG/ML IJ SOLN
1.0000 mg | INTRAMUSCULAR | Status: DC | PRN
  Administered 2013-07-18: 1 mg via INTRAVENOUS
  Filled 2013-07-17: qty 1

## 2013-07-17 MED ORDER — PROPOFOL 10 MG/ML IV BOLUS
INTRAVENOUS | Status: DC | PRN
  Administered 2013-07-17: 200 mg via INTRAVENOUS

## 2013-07-17 MED ORDER — LACTATED RINGERS IV SOLN
INTRAVENOUS | Status: DC
  Administered 2013-07-17: 12:00:00 via INTRAVENOUS

## 2013-07-17 MED ORDER — ASPIRIN EC 325 MG PO TBEC: 325.0000 mg | DELAYED_RELEASE_TABLET | Freq: Two times a day (BID) | ORAL | Status: DC

## 2013-07-17 MED ORDER — MIDAZOLAM HCL 2 MG/2ML IJ SOLN
INTRAMUSCULAR | Status: AC
  Administered 2013-07-17: 2 mg
  Filled 2013-07-17: qty 2

## 2013-07-17 SURGICAL SUPPLY — 67 items
BAG BANDED W/RUBBER/TAPE 36X54 (MISCELLANEOUS) ×2 IMPLANT
BAG EQP BAND 135X91 W/RBR TAPE (MISCELLANEOUS) ×1
BANDAGE ELASTIC 4 VELCRO ST LF (GAUZE/BANDAGES/DRESSINGS) ×2 IMPLANT
BANDAGE ELASTIC 6 VELCRO ST LF (GAUZE/BANDAGES/DRESSINGS) ×2 IMPLANT
BIT DRILL 3.5 QC 155 (BIT) ×1 IMPLANT
BIT DRILL 3.5 QC 155MM (BIT) ×1
BIT DRILL QC 2.7 6.3IN  SHORT (BIT) ×2
BIT DRILL QC 2.7 6.3IN SHORT (BIT) IMPLANT
BNDG COHESIVE 4X5 TAN STRL (GAUZE/BANDAGES/DRESSINGS) ×3 IMPLANT
BNDG COHESIVE 6X5 TAN STRL LF (GAUZE/BANDAGES/DRESSINGS) ×3 IMPLANT
CLOTH BEACON ORANGE TIMEOUT ST (SAFETY) ×3 IMPLANT
COVER DOME SNAP 22 D (MISCELLANEOUS) ×2 IMPLANT
COVER SURGICAL LIGHT HANDLE (MISCELLANEOUS) ×3 IMPLANT
CUFF TOURNIQUET SINGLE 34IN LL (TOURNIQUET CUFF) ×2 IMPLANT
CUFF TOURNIQUET SINGLE 44IN (TOURNIQUET CUFF) IMPLANT
DRAPE C-ARM 42X72 X-RAY (DRAPES) ×1 IMPLANT
DRAPE C-ARMOR (DRAPES) ×3 IMPLANT
DRAPE INCISE IOBAN 66X45 STRL (DRAPES) ×3 IMPLANT
DRAPE U-SHAPE 47X51 STRL (DRAPES) ×4 IMPLANT
DURAPREP 26ML APPLICATOR (WOUND CARE) ×3 IMPLANT
ELECT CAUTERY BLADE 6.4 (BLADE) ×3 IMPLANT
ELECT REM PT RETURN 9FT ADLT (ELECTROSURGICAL) ×3
ELECTRODE REM PT RTRN 9FT ADLT (ELECTROSURGICAL) ×1 IMPLANT
FACESHIELD LNG OPTICON STERILE (SAFETY) ×2 IMPLANT
GAUZE XEROFORM 5X9 LF (GAUZE/BANDAGES/DRESSINGS) ×3 IMPLANT
GLOVE BIO SURGEON STRL SZ7.5 (GLOVE) ×2 IMPLANT
GLOVE BIOGEL PI IND STRL 7.5 (GLOVE) IMPLANT
GLOVE BIOGEL PI INDICATOR 7.5 (GLOVE) ×2
GLOVE SURG SS PI 7.0 STRL IVOR (GLOVE) ×2 IMPLANT
GLOVE SURG SS PI 7.5 STRL IVOR (GLOVE) ×6 IMPLANT
GOWN STRL NON-REIN LRG LVL3 (GOWN DISPOSABLE) ×6 IMPLANT
GOWN STRL REIN XL XLG (GOWN DISPOSABLE) ×3 IMPLANT
KIT BASIN OR (CUSTOM PROCEDURE TRAY) ×3 IMPLANT
KIT ROOM TURNOVER OR (KITS) ×3 IMPLANT
NDL HYPO 25GX1X1/2 BEV (NEEDLE) ×1 IMPLANT
NEEDLE HYPO 25GX1X1/2 BEV (NEEDLE) ×3 IMPLANT
NS IRRIG 1000ML POUR BTL (IV SOLUTION) ×3 IMPLANT
PACK ORTHO EXTREMITY (CUSTOM PROCEDURE TRAY) ×3 IMPLANT
PAD ARMBOARD 7.5X6 YLW CONV (MISCELLANEOUS) ×6 IMPLANT
PAD CAST 3X4 CTTN HI CHSV (CAST SUPPLIES) ×2 IMPLANT
PADDING CAST COTTON 3X4 STRL (CAST SUPPLIES)
PADDING CAST COTTON 6X4 STRL (CAST SUPPLIES) ×3 IMPLANT
PLATE DISTAL LF 11 HOLE (Plate) ×2 IMPLANT
SCREW CANC 5.0X14 (Screw) ×2 IMPLANT
SCREW LOCK 12X3.5XST PRLC (Screw) IMPLANT
SCREW LOCK 3.5X12 (Screw) ×3 IMPLANT
SCREW NL 3.5X14 (Screw) ×6 IMPLANT
SCREW NLCK 16X3.5XST CORT PRLC (Screw) IMPLANT
SCREW NON LOCK 3.5X12 (Screw) ×6 IMPLANT
SCREW NONLOCK 3.5X16 (Screw) ×3 IMPLANT
SPONGE GAUZE 4X4 12PLY (GAUZE/BANDAGES/DRESSINGS) ×3 IMPLANT
SPONGE LAP 18X18 X RAY DECT (DISPOSABLE) ×6 IMPLANT
SUCTION FRAZIER TIP 10 FR DISP (SUCTIONS) ×3 IMPLANT
SUT ETHILON 2 0 FS 18 (SUTURE) IMPLANT
SUT ETHILON 3 0 PS 1 (SUTURE) ×2 IMPLANT
SUT VIC AB 0 CT1 27 (SUTURE) ×3
SUT VIC AB 0 CT1 27XBRD ANBCTR (SUTURE) IMPLANT
SUT VIC AB 2-0 CT1 27 (SUTURE) ×6
SUT VIC AB 2-0 CT1 TAPERPNT 27 (SUTURE) IMPLANT
SUT VIC AB 3-0 SH 27 (SUTURE) ×3
SUT VIC AB 3-0 SH 27X BRD (SUTURE) IMPLANT
SYR CONTROL 10ML LL (SYRINGE) ×3 IMPLANT
TOWEL OR 17X24 6PK STRL BLUE (TOWEL DISPOSABLE) ×3 IMPLANT
TOWEL OR 17X26 10 PK STRL BLUE (TOWEL DISPOSABLE) ×6 IMPLANT
TUBE CONNECTING 12'X1/4 (SUCTIONS) ×1
TUBE CONNECTING 12X1/4 (SUCTIONS) ×2 IMPLANT
WATER STERILE IRR 1000ML POUR (IV SOLUTION) ×1 IMPLANT

## 2013-07-17 NOTE — Anesthesia Preprocedure Evaluation (Addendum)
Anesthesia Evaluation  Patient identified by MRN, date of birth, ID band Patient awake    Reviewed: Allergy & Precautions, H&P , Patient's Chart, lab work & pertinent test results  Airway Mallampati: II      Dental   Pulmonary Current Smoker,  breath sounds clear to auscultation        Cardiovascular negative cardio ROS  Rhythm:Regular Rate:Normal     Neuro/Psych    GI/Hepatic Neg liver ROS, GERD-  ,  Endo/Other  negative endocrine ROS  Renal/GU negative Renal ROS     Musculoskeletal   Abdominal   Peds  Hematology   Anesthesia Other Findings   Reproductive/Obstetrics                        Anesthesia Physical Anesthesia Plan  ASA: II  Anesthesia Plan: General   Post-op Pain Management:    Induction: Intravenous  Airway Management Planned: LMA  Additional Equipment:   Intra-op Plan:   Post-operative Plan: Extubation in OR  Informed Consent: I have reviewed the patients History and Physical, chart, labs and discussed the procedure including the risks, benefits and alternatives for the proposed anesthesia with the patient or authorized representative who has indicated his/her understanding and acceptance.   Dental advisory given  Plan Discussed with: CRNA, Anesthesiologist and Surgeon  Anesthesia Plan Comments:        Anesthesia Quick Evaluation

## 2013-07-17 NOTE — Transfer of Care (Signed)
Immediate Anesthesia Transfer of Care Note  Patient: Catherine Nielsen  Procedure(s) Performed: Procedure(s): OPEN REDUCTION INTERNAL FIXATION (ORIF) ANKLE FRACTURE (Left)  Patient Location: PACU  Anesthesia Type:General  Level of Consciousness: awake, sedated and patient cooperative  Airway & Oxygen Therapy: Patient Spontanous Breathing and Patient connected to face mask oxygen  Post-op Assessment: Report given to PACU RN, Post -op Vital signs reviewed and stable and Patient moving all extremities  Post vital signs: Reviewed and stable  Complications: No apparent anesthesia complications

## 2013-07-17 NOTE — Anesthesia Procedure Notes (Signed)
Procedure Name: Intubation Date/Time: 07/17/2013 1:57 PM Performed by: Izora Gala Pre-anesthesia Checklist: Patient identified, Emergency Drugs available, Suction available, Patient being monitored and Timeout performed Patient Re-evaluated:Patient Re-evaluated prior to inductionOxygen Delivery Method: Circle system utilized and Simple face mask Preoxygenation: Pre-oxygenation with 100% oxygen Intubation Type: IV induction Ventilation: Mask ventilation without difficulty Laryngoscope Size: Mac and 4 Grade View: Grade I Tube type: Oral Tube size: 7.5 mm Number of attempts: 1 Airway Equipment and Method: Stylet Placement Confirmation: ETT inserted through vocal cords under direct vision,  positive ETCO2 and breath sounds checked- equal and bilateral Secured at: 22 cm Tube secured with: Tape Dental Injury: Teeth and Oropharynx as per pre-operative assessment

## 2013-07-17 NOTE — Op Note (Addendum)
Date of Surgery: 07/17/2013  INDICATIONS: Ms. Catherine Nielsen is a 58 y.o.-year-old female who sustained a left ankle fracture; she was indicated for open reduction and internal fixation due to the displaced nature of the articular fracture and came to the operating room today for this procedure. The patient did consent to the procedure after discussion of the risks and benefits.  PREOPERATIVE DIAGNOSIS: left lateral malleolar ankle fracture  POSTOPERATIVE DIAGNOSIS: Same.  PROCEDURE: Open treatment of left ankle fracture with internal fixation. Lateral malleolar CPT [id number];   SURGEON: N. Eduard Roux, M.D.  ASSIST: none.  ANESTHESIA:  general  IV FLUIDS AND URINE: See anesthesia.  ESTIMATED BLOOD LOSS: minimal mL.  IMPLANTS: [employer]  COMPLICATIONS: None.  DESCRIPTION OF PROCEDURE: The patient was brought to the operating room and placed supine on the operating table.  The patient had been signed prior to the procedure and this was documented. The patient had the anesthesia placed by the anesthesiologist.  A nonsterile tourniquet was placed on the upper thigh.  The prep verification and incision time-outs were performed to confirm that this was the correct patient, site, side and location. The patient had an SCD on the opposite lower extremity. The patient did receive antibiotics prior to the incision and was re-dosed during the procedure as needed at indicated intervals.  The patient had the lower extremity prepped and draped in the standard surgical fashion.  The extremity was exsanguinated using an esmarch bandage and the tourniquet was inflated to 350 mm Hg.  The bony landmarks were palpated. A laterally-based incision directly over the fibula was used. Blunt dissection was taken down to the fascial layer. The superficial peroneal nerve was identified and mobilized and protected during the procedure. The fascia was sharply incised in line with the incision. The lateral aspect of the  fibula and the fracture were both exposed. The fracture site was exposed and then any entrapped periosteum was retrieved from the fracture site. After this was done fracture reduction was achieved using a tenaculum clamp and manual reduction.  This was confirmed on x-ray. Once this was done an 11 hole fibular plate was sized. She had a very long oblique spiral fracture pattern that required a long plate.  I first placed anterior to posterior lag screw by technique.  The clamp was released and the fracture remained reduced. I then neutralized the fracture with a 11 hole plate on the lateral aspect of the fibula. I placed 4 screws proximal to the fracture site and 3 screws distal to the fracture site.  A external stress radiograph was taken to confirm no widening of the medial clear space or displacement of the syndesmosis.  Final x-rays were taken to confirm adequate plate placement and fracture reduction. The wound was thoroughly irrigated using sterile saline. The wounds closed in layer fashion using 0 Vicryl for the fascia, 2-0 Vicryl for the deep skin layer, and 3-0 nylon for the skin. A sterile dressing was applied and the extremity was placed in a short-leg splint. The patient awoke from anesthesia uneventfully and was transferred to the PACU in stable condition.  POSTOPERATIVE PLAN: Ms. Catherine Nielsen will remain nonweightbearing on this leg for approximately 6 weeks; Ms. Catherine Nielsen will return for suture removal in 2 weeks.  He will be immobilized in a short leg splint and then transitioned to a short leg cast at his first follow up appointment.  Ms. Catherine Nielsen will receive DVT prophylaxis based on other medications, activity level, and risk ratio of bleeding  to thrombosis.  Catherine Cecil, MD [employer] 3:51 PM

## 2013-07-17 NOTE — Progress Notes (Signed)
X-ray left ankle is pending, called radiology from PACU and waited for about 42mts, didn't show up. Called again, radiology said they will do it in patient's room @5N ,busy now. Moved the pt to her room. Gershon Crane

## 2013-07-17 NOTE — H&P (Signed)
PREOPERATIVE H&P  Chief Complaint: left ankle fracture  HPI: Catherine Nielsen is a 58 y.o. female who presents for surgical treatment of left ankle fracture.  She denies any changes in medical history.  Past Medical History  Diagnosis Date  . Fibroid (bleeding) (uterine)   . RLS (restless legs syndrome)   . Insomnia   . Anxiety   . Environmental allergies     ALLERGIES AND SINUS PROBLEMS  . Fall 07/10/13    FX'D LEFT ANKLE - DID HAVE A LOT OF SORENESS NECK AND RIGHT SHOULDER-BUT IMPROVING.; STATES FRACTURED ANKLE IS SPLINTED AND USING CRUTCHES AT HOME  . History of shingles 2010    RIGHT SIDE OF BACK - STILL HAS SENSATIONS OF MUSCLE CONTRACTIONS OR "LIGHTNING BOLT" SENSATION IN THAT AREA  . GERD (gastroesophageal reflux disease)     WITH CERTAIN FOODS -WOULD TAKE OTC MED IF NEEDED  . Headache(784.0)     SINUS HEADACHES  . Arthritis     ELBOWS   Past Surgical History  Procedure Laterality Date  . Cholecystectomy    . Tubal ligation     History   Social History  . Marital Status: Legally Separated    Spouse Name: N/A    Number of Children: N/A  . Years of Education: N/A   Social History Main Topics  . Smoking status: Current Every Day Smoker -- 0.25 packs/day for 30 years    Types: Cigarettes  . Smokeless tobacco: Never Used  . Alcohol Use: No  . Drug Use: No     Comment: past use  . Sexual Activity: No   Other Topics Concern  . None   Social History Narrative  . None   Family History  Problem Relation Age of Onset  . Cancer Mother   . Diabetes Mother   . Hypertension Father   . Diabetes Sister   . Hyperlipidemia Sister    No Known Allergies Prior to Admission medications   Medication Sig Start Date End Date Taking? Authorizing Provider  albuterol (PROVENTIL HFA;VENTOLIN HFA) 108 (90 BASE) MCG/ACT inhaler Inhale into the lungs every 6 (six) hours as needed for wheezing or shortness of breath.   Yes Historical Provider, MD  cetirizine (ZYRTEC) 10 MG tablet  Take 10 mg by mouth daily as needed.    Yes Historical Provider, MD  diclofenac (VOLTAREN) 75 MG EC tablet Take 75 mg by mouth 2 (two) times daily as needed for mild pain.    Yes Historical Provider, MD  HYDROcodone-acetaminophen (NORCO) 5-325 MG per tablet Take 1-2 tablets by mouth every 6 (six) hours as needed for moderate pain. 07/10/13  Yes Margarita Mail, PA-C  ibuprofen (ADVIL,MOTRIN) 200 MG tablet Take 400 mg by mouth every 6 (six) hours as needed for moderate pain.   Yes Historical Provider, MD  naproxen (NAPROSYN) 500 MG tablet Take 1 tablet (500 mg total) by mouth 2 (two) times daily with a meal. 07/10/13  Yes Margarita Mail, PA-C  cholecalciferol (VITAMIN D) 1000 UNITS tablet Take 1,000 Units by mouth daily.    Historical Provider, MD  diphenhydramine-acetaminophen (TYLENOL PM) 25-500 MG TABS Take 2 tablets by mouth daily as needed (sleep).    Historical Provider, MD  fluticasone (FLONASE) 50 MCG/ACT nasal spray Place 2 sprays into the nose daily. PRN 08/10/12   Carvel Getting, NP     Positive ROS: All other systems have been reviewed and were otherwise negative with the exception of those mentioned in the HPI and as above.  Physical  Exam: General: Alert, no acute distress Cardiovascular: No pedal edema Respiratory: No cyanosis, no use of accessory musculature GI: No organomegaly, abdomen is soft and non-tender Skin: No lesions in the area of chief complaint Neurologic: Sensation intact distally Psychiatric: Patient is competent for consent with normal mood and affect Lymphatic: No axillary or cervical lymphadenopathy  MUSCULOSKELETAL: exam stable  Assessment: left ankle fracture  Plan: Plan for Procedure(s): OPEN REDUCTION INTERNAL FIXATION (ORIF) ANKLE FRACTURE  The risks benefits and alternatives were discussed with the patient including but not limited to the risks of nonoperative treatment, versus surgical intervention including infection, bleeding, nerve injury,  blood  clots, cardiopulmonary complications, morbidity, mortality, among others, and they were willing to proceed.   Marianna Payment, MD   07/17/2013 1:16 PM

## 2013-07-17 NOTE — Progress Notes (Signed)
Orthopedic Tech Progress Note Patient Details:  Catherine Nielsen December 06, 1955 [id number]  Ortho Devices Ortho Device/Splint Location: put ohf on bed Ortho Device/Splint Interventions: Ordered;Application   Braulio Bosch 07/17/2013, 8:09 PM

## 2013-07-17 NOTE — Anesthesia Postprocedure Evaluation (Signed)
  Anesthesia Post-op Note  Patient: Catherine Nielsen  Procedure(s) Performed: Procedure(s): OPEN REDUCTION INTERNAL FIXATION (ORIF) ANKLE FRACTURE (Left)  Patient Location: PACU  Anesthesia Type:General  Level of Consciousness: awake  Airway and Oxygen Therapy: Patient Spontanous Breathing  Post-op Pain: mild  Post-op Assessment: Post-op Vital signs reviewed  Post-op Vital Signs: Reviewed  Complications: No apparent anesthesia complications

## 2013-07-18 ENCOUNTER — Encounter (HOSPITAL_COMMUNITY): Payer: Self-pay | Admitting: General Practice

## 2013-07-18 NOTE — Discharge Instructions (Signed)
1. Strict non weight bearing  2. Keep splint clean and dry 3. Elevate on 3 pillows 4. Take pain meds as prescribed   Home Health physical therapy to be provided by [facility] [phone]

## 2013-07-18 NOTE — Progress Notes (Signed)
   Subjective:  Patient reports pain as moderate.    Objective:   VITALS:   Filed Vitals:   07/17/13 1825 07/17/13 2111 07/18/13 0208 07/18/13 0552  BP: 152/66 163/91 133/70 146/74  Pulse: 90 95 111 107  Temp: 98.4 F (36.9 C) 98 F (36.7 C) 98.8 F (37.1 C) 98.4 F (36.9 C)  TempSrc:  Oral Oral Oral  Resp: 16 18 18 18   SpO2: 96% 97% 97% 98%    Neurologically intact Neurovascular intact Sensation intact distally Intact pulses distally Dorsiflexion/Plantar flexion intact Incision: dressing C/D/I and no drainage No cellulitis present Compartment soft   Lab Results  Component Value Date   WBC 4.1 07/16/2013   HGB 12.6 07/16/2013   HCT 38.1 07/16/2013   MCV 88.0 07/16/2013   PLT 385 07/16/2013     Assessment/Plan: 1 Day Post-Op   Problem List Items Addressed This Visit     Musculoskeletal and Integument   *Fracture of ankle, lateral malleolus, left, closed - Primary   Relevant Orders      Non weight bearing      Up with PT/OT DVT ppx - SCDs, ambulation, aspirin NWB left and lower extremity Pain control Discharge planning - dc home today after PT Rx in chart   Marianna Payment 07/18/2013, 10:57 AM [phone]

## 2013-07-18 NOTE — Care Management Note (Signed)
CARE MANAGEMENT NOTE 07/18/2013  Patient:  [facility]   Account Number:  [id number]  Date Initiated:  07/18/2013  Documentation initiated by:  Ricki Miller  Subjective/Objective Assessment:   58 yr old female s/p ORIF of left ankle.     Action/Plan:   Case Manager spoke with patient concerning [place] and DME needs at discharge. Very axious lady. Has crutches, Choice offered for home health agency. [facility] liasion notified.   Anticipated DC Date:  07/19/2013   Anticipated DC Plan:  [place]  CM consult      [facility] Choice  HOME HEALTH   Choice offered to / List presented to:  C-1 Patient        [place] arranged  [place] [facility]      [place].   Status of service:  Completed, signed off Medicare Important Message given?   (If response is "NO", the following Medicare IM given date fields will be blank) Date Medicare IM given:   Date Additional Medicare IM given:    Discharge Disposition:  [place]

## 2013-07-18 NOTE — Evaluation (Signed)
Occupational Therapy Evaluation Patient Details Name: Catherine Nielsen MRN: [id number] DOB: 12/14/1955 Today's Date: 07/18/2013 Time: 5956-3875 OT Time Calculation (min): 38 min  OT Assessment / Plan / Recommendation History of present illness s/p ORIF L ankle following a fall on 07/10/13.  Pt has been walking using crutches since.   Clinical Impression   Pt demonstrates good ability to perform ADL and transfers maintaining NWB on L LE.  Will benefit from 3 in1 for home.  No further OT needs.    OT Assessment  Patient does not need any further OT services    Follow Up Recommendations  No OT follow up    Barriers to Discharge      Equipment Recommendations  3 in 1 bedside comode    Recommendations for Other Services    Frequency       Precautions / Restrictions Precautions Precautions: Fall Restrictions Weight Bearing Restrictions: Yes LLE Weight Bearing: Non weight bearing   Pertinent Vitals/Pain VSS, premedicated, did not c/o pain    ADL  Eating/Feeding: Independent Where Assessed - Eating/Feeding: Bed level Grooming: Wash/dry hands;Teeth care;Set up Where Assessed - Grooming: Unsupported sitting Upper Body Bathing: Set up Where Assessed - Upper Body Bathing: Unsupported sitting Lower Body Bathing: Supervision/safety Where Assessed - Lower Body Bathing: Unsupported sitting;Supported sit to stand Upper Body Dressing: Set up Where Assessed - Upper Body Dressing: Unsupported sitting Lower Body Dressing: Supervision/safety Where Assessed - Lower Body Dressing: Unsupported sitting;Supported sit to stand Toilet Transfer: Supervision/safety Toilet Transfer Method: Arts development officer: Therapist, occupational and Hygiene: Modified independent Where Assessed - Best boy and Hygiene: Sit on 3-in-1 or toilet Equipment Used: Gait belt    OT Diagnosis:    OT Problem List:   OT Treatment Interventions:     OT  Goals(Current goals can be found in the care plan section) Acute Rehab OT Goals Patient Stated Goal: home with family  Visit Information  Last OT Received On: 07/18/13 Assistance Needed: +1 History of Present Illness: s/p ORIF L ankle following a fall on 07/10/13.  Pt has been walking using crutches since.       Prior [place] expects to be discharged to:: Private residence Living Arrangements: Children (adult children) Available Help at Discharge: Family;Available 24 hours/day Type of Home: Apartment Home Access: Stairs to enter Entrance Stairs-Number of Steps: 3 Entrance Stairs-Rails: Right Home Layout: Two level Alternate Level Stairs-Number of Steps: 1 flight Alternate Level Stairs-Rails: Right Home Equipment: Crutches Prior Function Level of Independence: Independent Communication Communication: No difficulties Dominant Hand: Right         Vision/Perception     Cognition  Cognition Arousal/Alertness: Awake/alert Behavior During Therapy: WFL for tasks assessed/performed Overall Cognitive Status: Within Functional Limits for tasks assessed    Extremity/Trunk Assessment Upper Extremity Assessment Upper Extremity Assessment: LUE deficits/detail LUE Deficits / Details: 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 Lower Extremity Assessment Lower Extremity Assessment: Defer to PT evaluation Cervical / Trunk Assessment Cervical / Trunk Assessment: Normal     Mobility Bed Mobility Overal bed mobility: Modified Independent Transfers Overall transfer level: Needs assistance Transfers: Stand Pivot Transfers Stand pivot transfers: Supervision     Exercise     Balance     End of Session OT - End of Session Activity Tolerance: Patient tolerated treatment well Patient left: in chair;with call bell/phone within reach  GO Functional Assessment Tool Used: clinical judgement Functional Limitation: Self care Self Care Current Status (I4332):  At least  1 percent but less than 20 percent impaired, limited or restricted Self Care Goal Status (X4585): At least 1 percent but less than 20 percent impaired, limited or restricted Self Care Discharge Status [phone]): At least 1 percent but less than 20 percent impaired, limited or restricted   Malka So 07/18/2013, 3:15 PM [phone]

## 2013-07-18 NOTE — Evaluation (Signed)
Physical Therapy Evaluation Patient Details Name: Catherine Nielsen MRN: [id number] DOB: 1956-01-27 Today's Date: 07/18/2013 Time: 4098-1191 PT Time Calculation (min): 28 min  PT Assessment / Plan / Recommendation History of Present Illness  s/p ORIF L ankle following a fall on 07/10/13.  Pt has been walking using crutches since.  Clinical Impression  Patient presents with decreased independence with mobility due to deficits listed in problem list.  She is unable to d/c home today due to inaccessible entry with snow covered steps.  Also c/o nausea and dizziness when up on her feet.  RN reports trying different medication.  Feel should be able to go home tomorrow after stair training.    PT Assessment  Patient needs continued PT services    Follow Up Recommendations  Home health PT;Supervision/Assistance - 24 hour    Does the patient have the potential to tolerate intense rehabilitation    N/A  Barriers to Discharge Inaccessible home environment snowy steps    Equipment Recommendations  Rolling walker with 5" wheels    Recommendations for Other Services   None  Frequency Min 5X/week    Precautions / Restrictions Precautions Precautions: Fall Restrictions Weight Bearing Restrictions: Yes LLE Weight Bearing: Non weight bearing   Pertinent Vitals/Pain C/o significant pain left LE; RN delivered medication      Mobility  Bed Mobility Overal bed mobility: Modified Independent General bed mobility comments: NT patient in chair Transfers Overall transfer level: Needs assistance Equipment used: Crutches;Rolling walker (2 wheeled) Transfers: Stand Pivot Transfers;Sit to/from Stand Sit to Stand: Min assist Stand pivot transfers: Min assist General transfer comment: used crutches first, but with difficulty; then used walker with improved stability and safety Ambulation/Gait Ambulation/Gait assistance: Min guard Ambulation Distance (Feet): 8 Feet (x 2) Assistive device: Rolling  walker (2 wheeled) Gait Pattern/deviations: Step-to pattern;Shuffle General Gait Details: hops in gym to and from exercise mat Stairs: Yes General stair comments: Patient did not practice steps due to nausea and dizziness (likely medication related.) Demonstrated sequence with crutch and rail    Exercises     PT Diagnosis: Difficulty walking;Acute pain  PT Problem List: Decreased mobility;Decreased activity tolerance;Decreased balance;Decreased knowledge of use of DME;Pain;Decreased strength PT Treatment Interventions: DME instruction;Therapeutic exercise;Gait training;Balance training;Stair training;Functional mobility training;Therapeutic activities;Patient/family education     PT Goals(Current goals can be found in the care plan section) Acute Rehab PT Goals Patient Stated Goal: home with family PT Goal Formulation: With patient Time For Goal Achievement: 07/25/13 Potential to Achieve Goals: Good  Visit Information  Last PT Received On: 07/18/13 Assistance Needed: +1 History of Present Illness: s/p ORIF L ankle following a fall on 07/10/13.  Pt has been walking using crutches since.       Prior [place] expects to be discharged to:: Private residence Living Arrangements: Children (adult children) Available Help at Discharge: Family;Available 24 hours/day Type of Home: Apartment Home Access: Stairs to enter Entrance Stairs-Number of Steps: 3 Entrance Stairs-Rails: Right Home Layout: Two level Alternate Level Stairs-Number of Steps: 1 flight Alternate Level Stairs-Rails: Right Home Equipment: Crutches Prior Function Level of Independence: Needs assistance Gait / Transfers Assistance Needed: son assist with stairs; limited walking (just to bathroom and only down steps when appointment) Communication Communication: No difficulties Dominant Hand: Right    Cognition  Cognition Arousal/Alertness: Awake/alert Behavior During Therapy: WFL for  tasks assessed/performed Overall Cognitive Status: Within Functional Limits for tasks assessed    Extremity/Trunk Assessment Upper Extremity Assessment Upper Extremity Assessment: Defer to OT  evaluation LUE Deficits / Details: 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 Lower Extremity Assessment Lower Extremity Assessment: LLE deficits/detail LLE Deficits / Details: splint at ankle wrapped with ace bandage; lifts leg off chair independent LLE: Unable to fully assess due to immobilization;Unable to fully assess due to pain Cervical / Trunk Assessment Cervical / Trunk Assessment: Normal   Balance Balance Overall balance assessment: Needs assistance Sitting balance-Leahy Scale: Good Standing balance-Leahy Scale: Poor Standing balance comment: unable to stand NWB on left without UE support   End of Session PT - End of Session Equipment Utilized During Treatment: Gait belt Activity Tolerance: Treatment limited secondary to medical complications (Comment) (nausea and dizziness possible medication related) Patient left: in chair;with call bell/phone within reach Nurse Communication: Patient requests pain meds;Other (comment) (unable to d/c home due to cannot negotiate steps for entry)  GP Functional Assessment Tool Used: Clinical Judgement Functional Limitation: Mobility: Walking and moving around Mobility: Walking and Moving Around Current Status (H0865): At least 20 percent but less than 40 percent impaired, limited or restricted Mobility: Walking and Moving Around Goal Status [phone]): At least 1 percent but less than 20 percent impaired, limited or restricted   [facility] 07/18/2013, 4:01 PM [place], [place] 07/18/2013

## 2013-07-19 ENCOUNTER — Encounter (HOSPITAL_COMMUNITY): Payer: Self-pay | Admitting: Orthopaedic Surgery

## 2013-07-19 NOTE — Discharge Summary (Signed)
Physician Discharge Summary      Patient ID: Catherine Nielsen MRN: [id number] DOB/AGE: [birth year] 58 y.o.  Admit date: 07/17/2013 Discharge date: 07/19/2013  Admission Diagnoses:  Fracture of ankle, lateral malleolus, left, closed  Discharge Diagnoses:  Principal Problem:   Fracture of ankle, lateral malleolus, left, closed Active Problems:   Fracture of left ankle, lateral malleolus   Past Medical History  Diagnosis Date  . Fibroid (bleeding) (uterine)   . RLS (restless legs syndrome)   . Insomnia   . Anxiety   . Environmental allergies     ALLERGIES AND SINUS PROBLEMS  . Fall 07/10/13    FX'D LEFT ANKLE - DID HAVE A LOT OF SORENESS NECK AND RIGHT SHOULDER-BUT IMPROVING.; STATES FRACTURED ANKLE IS SPLINTED AND USING CRUTCHES AT HOME  . History of shingles 2010    RIGHT SIDE OF BACK - STILL HAS SENSATIONS OF MUSCLE CONTRACTIONS OR "LIGHTNING BOLT" SENSATION IN THAT AREA  . GERD (gastroesophageal reflux disease)     WITH CERTAIN FOODS -WOULD TAKE OTC MED IF NEEDED  . Chronic bronchitis     "at least once/yr; more lately since I moved here from [place]" (07/18/2013)  . Anemia     "when I was little"  . Sinus headache   . Arthritis     "elbows; probably knees too" (07/18/2013)  . Sciatica   . Chronic upper back pain   . Depression     Surgeries: Procedure(s): OPEN REDUCTION INTERNAL FIXATION (ORIF) ANKLE FRACTURE on 07/17/2013   Consultants (if any):    Discharged Condition: Improved  Hospital Course: Catherine Nielsen is an 58 y.o. female who was admitted 07/17/2013 with a diagnosis of Fracture of ankle, lateral malleolus, left, closed and went to the operating room on 07/17/2013 and underwent the above named procedures.    She was given perioperative antibiotics:      Anti-infectives   Start     Dose/Rate Route Frequency Ordered Stop   07/17/13 1830  ceFAZolin (ANCEF) 3 g in dextrose 5 % 50 mL IVPB     3 g 160 mL/hr over 30 Minutes Intravenous Every 6 hours 07/17/13 1816  07/18/13 1229   07/17/13 1145  ceFAZolin (ANCEF) 2-3 GM-% IVPB SOLR    Comments:  Henrine Screws   : cabinet override      07/17/13 1145 07/17/13 2359   07/17/13 1143  ceFAZolin (ANCEF) IVPB 2 g/50 mL premix  Status:  Discontinued     2 g 100 mL/hr over 30 Minutes Intravenous On call to O.R. 07/17/13 1143 07/17/13 1816    .  She was given sequential compression devices, early ambulation, and aspirin for DVT prophylaxis.  She benefited maximally from the hospital stay and there were no complications.    Recent vital signs:  Filed Vitals:   07/19/13 1400  BP: 145/70  Pulse: 89  Temp: 98.6 F (37 C)  Resp: 16    Recent laboratory studies:  Lab Results  Component Value Date   HGB 12.6 07/16/2013   HGB 11.9* 11/25/2011   HGB 12.0 07/03/2008   Lab Results  Component Value Date   WBC 4.1 07/16/2013   PLT 385 07/16/2013   No results found for this basename: INR   Lab Results  Component Value Date   NA 138 07/16/2013   K 4.6 07/16/2013   CL 99 07/16/2013   CO2 29 07/16/2013   BUN 11 07/16/2013   CREATININE 0.64 07/16/2013   GLUCOSE 161* 07/16/2013    Discharge Medications:  Medication List         albuterol 108 (90 BASE) MCG/ACT inhaler  Commonly known as:  PROVENTIL HFA;VENTOLIN HFA  Inhale into the lungs every 6 (six) hours as needed for wheezing or shortness of breath.     aspirin EC 325 MG tablet  Take 1 tablet (325 mg total) by mouth 2 (two) times daily.     cetirizine 10 MG tablet  Commonly known as:  ZYRTEC  Take 10 mg by mouth daily as needed.     cholecalciferol 1000 UNITS tablet  Commonly known as:  VITAMIN D  Take 1,000 Units by mouth daily.     diclofenac 75 MG EC tablet  Commonly known as:  VOLTAREN  Take 75 mg by mouth 2 (two) times daily as needed for mild pain.     diphenhydramine-acetaminophen 25-500 MG Tabs  Commonly known as:  TYLENOL PM  Take 2 tablets by mouth daily as needed (sleep).     fluticasone 50 MCG/ACT nasal spray  Commonly known  as:  FLONASE  Place 2 sprays into the nose daily. PRN     HYDROcodone-acetaminophen 5-325 MG per tablet  Commonly known as:  NORCO  Take 1-2 tablets by mouth every 6 (six) hours as needed for moderate pain.     ibuprofen 200 MG tablet  Commonly known as:  ADVIL,MOTRIN  Take 400 mg by mouth every 6 (six) hours as needed for moderate pain.     naproxen 500 MG tablet  Commonly known as:  NAPROSYN  Take 1 tablet (500 mg total) by mouth 2 (two) times daily with a meal.     oxyCODONE 5 MG immediate release tablet  Commonly known as:  Oxy IR/ROXICODONE  Take 1-3 tablets (5-15 mg total) by mouth every 4 (four) hours as needed.        Diagnostic Studies: Dg Ankle Complete Left  07/19/13   CLINICAL DATA:  Fall.  EXAM: LEFT ANKLE COMPLETE - 3+ VIEW  FINDINGS: Spiral fracture of the distal left fibular shaft is noted. Fracture slightly displaced. Medial malleolus intact. Disuse soft tissue swelling. No foreign body.  IMPRESSION: Spiral displaced fracture of the distal left fibular shaft.   Electronically Signed   By: Marcello Moores  Register   On: 07/19/2013 16:37   Dg Ankle Left Port  07/17/2013   CLINICAL DATA:  ORIF of a left ankle fracture.  EXAM: PORTABLE LEFT ANKLE - 2 VIEW  COMPARISON:  07/19/13  FINDINGS: A lateral fixation plate extends across the distal fibular shaft to the tip of the distal fibula. There multiple, well-seated fixation screws. The orthopedic hardware reduces the spiral distal fibular fracture fragments into anatomic alignment. The ankle mortise is normally space and aligned. There is no evidence of an operative complication.  IMPRESSION: ORIF of a left distal fibular fracture as described.   Electronically Signed   By: Lajean Manes M.D.   On: 07/17/2013 20:45    Disposition: 01-Home or Self Care  Discharge Orders   Future Orders Complete By Expires   Call MD / Call 911  As directed    Comments:     If you experience chest pain or shortness of breath, CALL 911 and be  transported to the hospital emergency room.  If you develope a fever above 101.5 F, pus (white drainage) or increased drainage or redness at the wound, or calf pain, call your surgeon's office.   Call MD / Call 911  As directed    Comments:  If you experience chest pain or shortness of breath, CALL 911 and be transported to the hospital emergency room.  If you develope a fever above 101.5 F, pus (white drainage) or increased drainage or redness at the wound, or calf pain, call your surgeon's office.   Constipation Prevention  As directed    Comments:     Drink plenty of fluids.  Prune juice may be helpful.  You may use a stool softener, such as Colace (over the counter) 100 mg twice a day.  Use MiraLax (over the counter) for constipation as needed.   Constipation Prevention  As directed    Comments:     Drink plenty of fluids.  Prune juice may be helpful.  You may use a stool softener, such as Colace (over the counter) 100 mg twice a day.  Use MiraLax (over the counter) for constipation as needed.   Diet - low sodium heart healthy  As directed    Diet - low sodium heart healthy  As directed    Driving restrictions  As directed    Comments:     No driving while taking narcotic pain meds.   Driving restrictions  As directed    Comments:     No driving while taking narcotic pain meds.   Increase activity slowly as tolerated  As directed    Increase activity slowly as tolerated  As directed    Non weight bearing  As directed    Questions:     Laterality:     Extremity:        Follow-up Information   Follow up with Marianna Payment, MD In 2 weeks.   Specialty:  Orthopedic Surgery   Contact information:   [place] [id number] [phone]        Signed: Marianna Payment 07/19/2013, 8:36 PM

## 2013-07-19 NOTE — Progress Notes (Signed)
Physical Therapy Treatment Patient Details Name: Catherine Nielsen MRN: [id number] DOB: [birth year] Today's Date: 07/19/2013 Time: 4098-1191 PT Time Calculation (min): 29 min  PT Assessment / Plan / Recommendation  History of Present Illness s/p ORIF L ankle following a fall on 07/10/13.  Pt has been walking using crutches since.   PT Comments   Pt progressing slowly towards physical therapy goals. At this time, pt unsafe to negotiate 3 steps to enter home without significant assist. Only able to perform 1 step each attempt, therapist stopped stair training due to safety. Spoke at length with pt regarding other options for safe entry into home. Pt declined option of w/c - at this time, PT recommending non-emergent ambulance transport.    Follow Up Recommendations  Home health PT;Supervision/Assistance - 24 hour     Does the patient have the potential to tolerate intense rehabilitation     Barriers to Discharge        Equipment Recommendations  Rolling walker with 5" wheels    Recommendations for Other Services    Frequency Min 5X/week   Progress towards PT Goals Progress towards PT goals: Progressing toward goals  Plan Current plan remains appropriate    Precautions / Restrictions Precautions Precautions: Fall Restrictions Weight Bearing Restrictions: Yes LLE Weight Bearing: Non weight bearing   Pertinent Vitals/Pain Pt reports minimal pain throughout session.     Mobility  Bed Mobility Overal bed mobility: Modified Independent General bed mobility comments: Able to transfer to EOB without trapeze or bed rails. HOB flat.  Transfers Overall transfer level: Needs assistance Equipment used: Rolling walker (2 wheeled) Transfers: Sit to/from Stand Sit to Stand: Min guard General transfer comment: VC's for hand placement on seated surface for safety.  Ambulation/Gait Ambulation/Gait assistance: Min guard Ambulation Distance (Feet): 20 Feet (10' in room, 5'x2 in ortho  gym) Assistive device: Rolling walker (2 wheeled) Gait Pattern/deviations: Step-to pattern Gait velocity: Decreased Gait velocity interpretation: Below normal speed for age/gender Stairs: Yes Stairs assistance: +2 safety/equipment Stair Management: Backwards;With walker;Forwards;With crutches Number of Stairs: 2 (1x2) General stair comments: Pt unsafe with stair negotiation with walker as well as with crutch/hand rail. Only able to perform 1 step each time, therapist stopped stair training due to safety. Spoke at length with pt regarding other options for safe entry into home. At this time, PT recommending non-emergent ambulance transport.     Exercises     PT Diagnosis:    PT Problem List:   PT Treatment Interventions:     PT Goals (current goals can now be found in the care plan section) Acute Rehab PT Goals Patient Stated Goal: home with family PT Goal Formulation: With patient Time For Goal Achievement: 07/25/13 Potential to Achieve Goals: Good  Visit Information  Last PT Received On: 07/19/13 Assistance Needed: +1 History of Present Illness: s/p ORIF L ankle following a fall on 07/10/13.  Pt has been walking using crutches since.    Subjective Data  Subjective: "I want to talk to my family before I make any decisions on an ambulance transfer." Patient Stated Goal: home with family   Cognition  Cognition Arousal/Alertness: Awake/alert Behavior During Therapy: WFL for tasks assessed/performed Overall Cognitive Status: Within Functional Limits for tasks assessed    Balance  Balance Overall balance assessment: Needs assistance Sitting-balance support: Feet supported;Bilateral upper extremity supported Sitting balance-Leahy Scale: Good Standing balance support: Bilateral upper extremity supported Standing balance-Leahy Scale: Poor  End of Session PT - End of Session Equipment Utilized During Treatment: Gait  belt Activity Tolerance: Patient limited by fatigue Patient  left: in chair;with call bell/phone within reach Nurse Communication: Mobility status   GP     Jolyn Lent 07/19/2013, 11:29 AM  Jolyn Lent, PT, DPT [phone]

## 2013-07-19 NOTE — Progress Notes (Signed)
Discharge instructions given. Pt verbalized understanding and all questions were answered.   Discharge disposition: Home with home health services

## 2013-09-10 ENCOUNTER — Ambulatory Visit: Payer: No Typology Code available for payment source | Attending: Orthopaedic Surgery

## 2013-09-10 DIAGNOSIS — IMO0001 Reserved for inherently not codable concepts without codable children: Secondary | ICD-10-CM | POA: Insufficient documentation

## 2013-09-10 DIAGNOSIS — M25579 Pain in unspecified ankle and joints of unspecified foot: Secondary | ICD-10-CM | POA: Insufficient documentation

## 2013-09-16 ENCOUNTER — Ambulatory Visit: Payer: No Typology Code available for payment source | Admitting: Physical Therapy

## 2013-09-18 ENCOUNTER — Ambulatory Visit: Payer: No Typology Code available for payment source | Admitting: Physical Therapy

## 2013-09-23 ENCOUNTER — Ambulatory Visit: Payer: No Typology Code available for payment source | Attending: Orthopaedic Surgery

## 2013-09-23 DIAGNOSIS — Z5189 Encounter for other specified aftercare: Secondary | ICD-10-CM | POA: Insufficient documentation

## 2013-09-23 DIAGNOSIS — M25579 Pain in unspecified ankle and joints of unspecified foot: Secondary | ICD-10-CM | POA: Insufficient documentation

## 2013-09-25 ENCOUNTER — Ambulatory Visit: Payer: No Typology Code available for payment source | Admitting: Rehabilitation

## 2013-09-30 ENCOUNTER — Encounter: Payer: No Typology Code available for payment source | Admitting: Physical Therapy

## 2013-10-03 ENCOUNTER — Ambulatory Visit: Payer: No Typology Code available for payment source

## 2013-10-08 ENCOUNTER — Encounter: Payer: No Typology Code available for payment source | Admitting: Physical Therapy

## 2013-10-10 ENCOUNTER — Encounter: Payer: No Typology Code available for payment source | Admitting: Physical Therapy

## 2013-10-28 ENCOUNTER — Encounter: Payer: Self-pay | Admitting: Internal Medicine

## 2013-10-28 ENCOUNTER — Ambulatory Visit: Payer: No Typology Code available for payment source | Attending: Internal Medicine | Admitting: Internal Medicine

## 2013-10-28 VITALS — BP 140/99 | HR 98 | Temp 97.8°F | Resp 16 | Wt 252.6 lb

## 2013-10-28 DIAGNOSIS — F17200 Nicotine dependence, unspecified, uncomplicated: Secondary | ICD-10-CM | POA: Insufficient documentation

## 2013-10-28 DIAGNOSIS — Z139 Encounter for screening, unspecified: Secondary | ICD-10-CM

## 2013-10-28 DIAGNOSIS — M25572 Pain in left ankle and joints of left foot: Secondary | ICD-10-CM

## 2013-10-28 DIAGNOSIS — R030 Elevated blood-pressure reading, without diagnosis of hypertension: Secondary | ICD-10-CM

## 2013-10-28 DIAGNOSIS — I10 Essential (primary) hypertension: Secondary | ICD-10-CM | POA: Insufficient documentation

## 2013-10-28 DIAGNOSIS — Z791 Long term (current) use of non-steroidal anti-inflammatories (NSAID): Secondary | ICD-10-CM | POA: Insufficient documentation

## 2013-10-28 DIAGNOSIS — Z79899 Other long term (current) drug therapy: Secondary | ICD-10-CM | POA: Insufficient documentation

## 2013-10-28 DIAGNOSIS — G4700 Insomnia, unspecified: Secondary | ICD-10-CM | POA: Insufficient documentation

## 2013-10-28 DIAGNOSIS — Z8742 Personal history of other diseases of the female genital tract: Secondary | ICD-10-CM | POA: Insufficient documentation

## 2013-10-28 DIAGNOSIS — Z7982 Long term (current) use of aspirin: Secondary | ICD-10-CM | POA: Insufficient documentation

## 2013-10-28 DIAGNOSIS — Z86018 Personal history of other benign neoplasm: Secondary | ICD-10-CM | POA: Insufficient documentation

## 2013-10-28 DIAGNOSIS — IMO0001 Reserved for inherently not codable concepts without codable children: Secondary | ICD-10-CM | POA: Insufficient documentation

## 2013-10-28 DIAGNOSIS — M25579 Pain in unspecified ankle and joints of unspecified foot: Secondary | ICD-10-CM | POA: Insufficient documentation

## 2013-10-28 DIAGNOSIS — J45909 Unspecified asthma, uncomplicated: Secondary | ICD-10-CM | POA: Insufficient documentation

## 2013-10-28 LAB — CBC WITH DIFFERENTIAL/PLATELET
Basophils Absolute: 0.1 10*3/uL (ref 0.0–0.1)
Basophils Relative: 1 % (ref 0–1)
EOS PCT: 1 % (ref 0–5)
Eosinophils Absolute: 0.1 10*3/uL (ref 0.0–0.7)
HCT: 37.2 % (ref 36.0–46.0)
Hemoglobin: 12.9 g/dL (ref 12.0–15.0)
Lymphocytes Relative: 33 % (ref 12–46)
Lymphs Abs: 1.9 10*3/uL (ref 0.7–4.0)
MCH: 29.4 pg (ref 26.0–34.0)
MCHC: 34.7 g/dL (ref 30.0–36.0)
MCV: 84.7 fL (ref 78.0–100.0)
Monocytes Absolute: 0.5 10*3/uL (ref 0.1–1.0)
Monocytes Relative: 9 % (ref 3–12)
NEUTROS ABS: 3.2 10*3/uL (ref 1.7–7.7)
Neutrophils Relative %: 56 % (ref 43–77)
PLATELETS: 403 10*3/uL — AB (ref 150–400)
RBC: 4.39 MIL/uL (ref 3.87–5.11)
RDW: 13.5 % (ref 11.5–15.5)
WBC: 5.7 10*3/uL (ref 4.0–10.5)

## 2013-10-28 LAB — TSH: TSH: 1.188 u[IU]/mL (ref 0.350–4.500)

## 2013-10-28 MED ORDER — DICLOFENAC SODIUM 75 MG PO TBEC: 75.0000 mg | DELAYED_RELEASE_TABLET | Freq: Two times a day (BID) | ORAL | Status: DC | PRN

## 2013-10-28 MED ORDER — NICOTINE 21 MG/24HR TD PT24: 21.0000 mg | MEDICATED_PATCH | Freq: Every day | TRANSDERMAL | Status: DC

## 2013-10-28 MED ORDER — CYCLOBENZAPRINE HCL 10 MG PO TABS: 10.0000 mg | ORAL_TABLET | Freq: Three times a day (TID) | ORAL | Status: DC | PRN

## 2013-10-28 NOTE — Progress Notes (Signed)
Patient here to establish care  

## 2013-10-28 NOTE — Patient Instructions (Signed)
DASH Diet  The DASH diet stands for "Dietary Approaches to Stop Hypertension." It is a healthy eating plan that has been shown to reduce high blood pressure (hypertension) in as little as 14 days, while also possibly providing other significant health benefits. These other health benefits include reducing the risk of breast cancer after menopause and reducing the risk of type 2 diabetes, heart disease, colon cancer, and stroke. Health benefits also include weight loss and slowing kidney failure in patients with chronic kidney disease.   DIET GUIDELINES  · Limit salt (sodium). Your diet should contain less than 1500 mg of sodium daily.  · Limit refined or processed carbohydrates. Your diet should include mostly whole grains. Desserts and added sugars should be used sparingly.  · Include small amounts of heart-healthy fats. These types of fats include nuts, oils, and tub margarine. Limit saturated and trans fats. These fats have been shown to be harmful in the body.  CHOOSING FOODS   The following food groups are based on a 2000 calorie diet. See your Registered Dietitian for individual calorie needs.  Grains and Grain Products (6 to 8 servings daily)  · Eat More Often: Whole-wheat bread, brown rice, whole-grain or wheat pasta, quinoa, popcorn without added fat or salt (air popped).  · Eat Less Often: White bread, white pasta, white rice, cornbread.  Vegetables (4 to 5 servings daily)  · Eat More Often: Fresh, frozen, and canned vegetables. Vegetables may be raw, steamed, roasted, or grilled with a minimal amount of fat.  · Eat Less Often/Avoid: Creamed or fried vegetables. Vegetables in a cheese sauce.  Fruit (4 to 5 servings daily)  · Eat More Often: All fresh, canned (in natural juice), or frozen fruits. Dried fruits without added sugar. One hundred percent fruit juice (½ cup [237 mL] daily).  · Eat Less Often: Dried fruits with added sugar. Canned fruit in light or heavy syrup.  Lean Meats, Fish, and Poultry (2  servings or less daily. One serving is 3 to 4 oz [85-114 g]).  · Eat More Often: Ninety percent or leaner ground beef, tenderloin, sirloin. Round cuts of beef, chicken breast, turkey breast. All fish. Grill, bake, or broil your meat. Nothing should be fried.  · Eat Less Often/Avoid: Fatty cuts of meat, turkey, or chicken leg, thigh, or wing. Fried cuts of meat or fish.  Dairy (2 to 3 servings)  · Eat More Often: Low-fat or fat-free milk, low-fat plain or light yogurt, reduced-fat or part-skim cheese.  · Eat Less Often/Avoid: Milk (whole, 2%). Whole milk yogurt. Full-fat cheeses.  Nuts, Seeds, and Legumes (4 to 5 servings per week)  · Eat More Often: All without added salt.  · Eat Less Often/Avoid: Salted nuts and seeds, canned beans with added salt.  Fats and Sweets (limited)  · Eat More Often: Vegetable oils, tub margarines without trans fats, sugar-free gelatin. Mayonnaise and salad dressings.  · Eat Less Often/Avoid: Coconut oils, palm oils, butter, stick margarine, cream, half and half, cookies, candy, pie.  FOR MORE INFORMATION  The Dash Diet Eating Plan: www.dashdiet.org  Document Released: 04/28/2011 Document Revised: 08/01/2011 Document Reviewed: 04/28/2011  [employer]® Patient Information ©2014 [employer].

## 2013-10-28 NOTE — Progress Notes (Signed)
Patient Demographics  Catherine Nielsen, is a 58 y.o. female  MOQ:[id number]  PTW:[id number]  DOB - 12/15/1955  CC:  Chief Complaint  Patient presents with  . Establish Care       HPI: Catherine Nielsen is a 58 y.o. female here today to establish medical care.She has history of left distal fibular fracture in February and had ORIF, and is taking pain medication prescribed by her orthopedics Dr. she also history of fibroids currently denies any heavy bleeding, today her blood pressure is elevated denies any headache dizziness chest and shortness of breath, she reported family history of hypertension, she does smoke cigarettes, I have advised patient to quit smoking she is going to try nicotine patch, she also reported history of asthma but symptoms are controlled has not used albuterol. Patient has No headache, No chest pain, No abdominal pain - No Nausea, No new weakness tingling or numbness, No Cough - SOB.  No Known Allergies Past Medical History  Diagnosis Date  . Fibroid (bleeding) (uterine)   . RLS (restless legs syndrome)   . Insomnia   . Anxiety   . Environmental allergies     ALLERGIES AND SINUS PROBLEMS  . Fall 07/10/13    FX'D LEFT ANKLE - DID HAVE A LOT OF SORENESS NECK AND RIGHT SHOULDER-BUT IMPROVING.; STATES FRACTURED ANKLE IS SPLINTED AND USING CRUTCHES AT HOME  . History of shingles 2010    RIGHT SIDE OF BACK - STILL HAS SENSATIONS OF MUSCLE CONTRACTIONS OR "LIGHTNING BOLT" SENSATION IN THAT AREA  . GERD (gastroesophageal reflux disease)     WITH CERTAIN FOODS -WOULD TAKE OTC MED IF NEEDED  . Chronic bronchitis     "at least once/yr; more lately since I moved here from [place]" (07/18/2013)  . Anemia     "when I was little"  . Sinus headache   . Arthritis     "elbows; probably knees too" (07/18/2013)  . Sciatica   . Chronic upper back pain   . Depression    Current Outpatient Prescriptions on File Prior to Visit  Medication Sig Dispense Refill  . albuterol  (PROVENTIL HFA;VENTOLIN HFA) 108 (90 BASE) MCG/ACT inhaler Inhale into the lungs every 6 (six) hours as needed for wheezing or shortness of breath.      Marland Kitchen aspirin EC 325 MG tablet Take 1 tablet (325 mg total) by mouth 2 (two) times daily.  84 tablet  0  . cetirizine (ZYRTEC) 10 MG tablet Take 10 mg by mouth daily as needed.       . cholecalciferol (VITAMIN D) 1000 UNITS tablet Take 1,000 Units by mouth daily.      . diclofenac (VOLTAREN) 75 MG EC tablet Take 75 mg by mouth 2 (two) times daily as needed for mild pain.       . diphenhydramine-acetaminophen (TYLENOL PM) 25-500 MG TABS Take 2 tablets by mouth daily as needed (sleep).      . fluticasone (FLONASE) 50 MCG/ACT nasal spray Place 2 sprays into the nose daily. PRN      . HYDROcodone-acetaminophen (NORCO) 5-325 MG per tablet Take 1-2 tablets by mouth every 6 (six) hours as needed for moderate pain.  20 tablet  0  . ibuprofen (ADVIL,MOTRIN) 200 MG tablet Take 400 mg by mouth every 6 (six) hours as needed for moderate pain.      . naproxen (NAPROSYN) 500 MG tablet Take 1 tablet (500 mg total) by mouth 2 (two) times daily with a meal.  30 tablet  0  . oxyCODONE (OXY IR/ROXICODONE) 5 MG immediate release tablet Take 1-3 tablets (5-15 mg total) by mouth every 4 (four) hours as needed.  90 tablet  0   No current facility-administered medications on file prior to visit.   Family History  Problem Relation Age of Onset  . Cancer Mother   . Diabetes Mother   . Hypertension Father   . Heart disease Father   . Diabetes Sister   . Hyperlipidemia Sister   . Hypertension Sister    History   Social History  . Marital Status: Legally Separated    Spouse Name: N/A    Number of Children: N/A  . Years of Education: N/A   Occupational History  . Not on file.   Social History Main Topics  . Smoking status: Current Every Day Smoker -- 0.50 packs/day for 30 years    Types: Cigarettes    Last Attempt to Quit: 07/10/2013  . Smokeless tobacco: Never  Used  . Alcohol Use: Yes     Comment: 07/18/2013 "couple times/yr I'll have a drink; use to drink more than that"  . Drug Use: Yes    Special: Cocaine, Marijuana     Comment: 07/18/2013 "stopped drugs in the 1980's"  . Sexual Activity: No   Other Topics Concern  . Not on file   Social History Narrative  . No narrative on file    Review of Systems: Constitutional: Negative for fever, chills, diaphoresis, activity change, appetite change and fatigue. HENT: Negative for ear pain, nosebleeds, congestion, facial swelling, rhinorrhea, neck pain, neck stiffness and ear discharge.  Eyes: Negative for pain, discharge, redness, itching and visual disturbance. Respiratory: Negative for cough, choking, chest tightness, shortness of breath, wheezing and stridor.  Cardiovascular: Negative for chest pain, palpitations and leg swelling. Gastrointestinal: Negative for abdominal distention. Genitourinary: Negative for dysuria, urgency, frequency, hematuria, flank pain, decreased urine volume, difficulty urinating and dyspareunia.  Musculoskeletal: Negative for back pain, joint swelling, +arthralgia and gait problem. Neurological: Negative for dizziness, tremors, seizures, syncope, facial asymmetry, speech difficulty, weakness, light-headedness, numbness and headaches.  Hematological: Negative for adenopathy. Does not bruise/bleed easily. Psychiatric/Behavioral: Negative for hallucinations, behavioral problems, confusion, dysphoric mood, decreased concentration and agitation.    Objective:   Filed Vitals:   10/28/13 1513  BP: 140/99  Pulse: 98  Temp: 97.8 F (36.6 C)  Resp: 16    Physical Exam: Constitutional: Patient appears well-developed and well-nourished. No distress. HENT: Normocephalic, atraumatic, External right and left ear normal. Oropharynx is clear and moist.  Eyes: Conjunctivae and EOM are normal. PERRLA, no scleral icterus. Neck: Normal ROM. Neck supple. No JVD. No tracheal  deviation. No thyromegaly. CVS: RRR, S1/S2 +, no murmurs, no gallops, no carotid bruit.  Pulmonary: Effort and breath sounds normal, no stridor, rhonchi, wheezes, rales.  Abdominal: Soft. BS +, no distension, tenderness, rebound or guarding.  Musculoskeletal: Normal range of motion. No edema and no tenderness.  Lymphadenopathy: No lymphadenopathy noted, cervical, inguinal or axillary Neuro: Alert. Normal reflexes, muscle tone coordination. No cranial nerve deficit. Skin: Skin is warm and dry. No rash noted. Not diaphoretic. No erythema. No pallor.old surgical scar on left ankle/leg  Psychiatric: Normal mood and affect. Behavior, judgment, thought content normal.  Lab Results  Component Value Date   WBC 4.1 07/16/2013   HGB 12.6 07/16/2013   HCT 38.1 07/16/2013   MCV 88.0 07/16/2013   PLT 385 07/16/2013   Lab Results  Component Value Date   CREATININE 0.64 07/16/2013  BUN 11 07/16/2013   NA 138 07/16/2013   K 4.6 07/16/2013   CL 99 07/16/2013   CO2 29 07/16/2013    No results found for this basename: HGBA1C   Lipid Panel     Component Value Date/Time   CHOL 259* 08/10/2012 1215   TRIG 228* 08/10/2012 1215   HDL 37* 08/10/2012 1215   CHOLHDL 7.0 08/10/2012 1215   VLDL 46* 08/10/2012 1215   LDLCALC 176* 08/10/2012 1215       Assessment and plan:   1. Smoking  - nicotine (NICODERM CQ) 21 mg/24hr patch; Place 1 patch (21 mg total) onto the skin daily.  Dispense: 28 patch; Refill: 0  2. Elevated BP I advised patient for DASH diet  3. Ankle pain, left On pain medication following up with orthopedics  4. Unspecified asthma(493.90) Controlled.  5. Screening Baseline blood work.  - CBC with Differential - COMPLETE METABOLIC PANEL WITH GFR - TSH - Lipid panel - Vit D  25 hydroxy (rtn osteoporosis monitoring) - MM DIGITAL SCREENING BILATERAL; Future - Ambulatory referral to Obstetrics / Gynecology  6. History of uterine fibroid  - Ambulatory referral to Obstetrics /  Gynecology      Health Maintenance -Colonoscopy: as per she is uptodate with colonoscopy 3 years ago, obtain records -Pap Smear: referred to GYN -Mammogram: ordered   Return in about 3 months (around 01/28/2014) for Elevated BP.   Lorayne Marek, MD

## 2013-10-29 ENCOUNTER — Other Ambulatory Visit: Payer: Self-pay

## 2013-10-29 ENCOUNTER — Telehealth: Payer: Self-pay

## 2013-10-29 LAB — COMPLETE METABOLIC PANEL WITH GFR
ALT: 21 U/L (ref 0–35)
AST: 13 U/L (ref 0–37)
Albumin: 4 g/dL (ref 3.5–5.2)
Alkaline Phosphatase: 82 U/L (ref 39–117)
BUN: 10 mg/dL (ref 6–23)
CALCIUM: 9.7 mg/dL (ref 8.4–10.5)
CHLORIDE: 97 meq/L (ref 96–112)
CO2: 25 mEq/L (ref 19–32)
Creat: 0.67 mg/dL (ref 0.50–1.10)
GFR, Est African American: >89 mL/min
GFR, Est Non African American: >89 mL/min
Glucose, Bld: 274 mg/dL — ABNORMAL HIGH (ref 70–99)
Potassium: 4.4 mEq/L (ref 3.5–5.3)
Sodium: 133 mEq/L — ABNORMAL LOW (ref 135–145)
Total Bilirubin: 0.2 mg/dL (ref 0.2–1.2)
Total Protein: 7.4 g/dL (ref 6.0–8.3)

## 2013-10-29 LAB — LIPID PANEL
CHOL/HDL RATIO: 7.7 ratio
CHOLESTEROL: 247 mg/dL — AB (ref 0–200)
HDL: 32 mg/dL — AB (ref >39)
Triglycerides: 421 mg/dL — ABNORMAL HIGH (ref <150)

## 2013-10-29 LAB — VITAMIN D 25 HYDROXY (VIT D DEFICIENCY, FRACTURES): Vit D, 25-Hydroxy: 35 ng/mL (ref 30–89)

## 2013-10-29 MED ORDER — FENOFIBRATE 145 MG PO TABS: 145.0000 mg | ORAL_TABLET | Freq: Every day | ORAL | Status: DC

## 2013-10-29 NOTE — Telephone Encounter (Signed)
Patient is aware of her lab results Prescription sent to pharmacy on file Appointment given for A1C

## 2013-10-29 NOTE — Telephone Encounter (Signed)
Message copied by Dorothe Pea on Tue Oct 29, 2013  3:53 PM ------      Message from: Lorayne Marek      Created: Tue Oct 29, 2013 10:02 AM       Blood work reviewed, noticed elevated triglycerides, advise patient for low fat diet. And start taking fenofibrate 145 mg daily      Also noticed her blood sugar is elevated, advise patient to come to the office in a few days for A1c check, if elevated will consider starting on metformin. ------

## 2013-11-08 ENCOUNTER — Other Ambulatory Visit: Payer: No Typology Code available for payment source

## 2013-12-12 ENCOUNTER — Encounter: Payer: No Typology Code available for payment source | Admitting: Obstetrics & Gynecology

## 2013-12-12 ENCOUNTER — Other Ambulatory Visit: Payer: Self-pay | Admitting: Internal Medicine

## 2013-12-12 ENCOUNTER — Ambulatory Visit (HOSPITAL_COMMUNITY)
Admission: RE | Admit: 2013-12-12 | Discharge: 2013-12-12 | Disposition: A | Discharge status: Home / Self Care | Payer: No Typology Code available for payment source | Source: Ambulatory Visit | Attending: Internal Medicine | Admitting: Internal Medicine

## 2013-12-12 DIAGNOSIS — Z139 Encounter for screening, unspecified: Secondary | ICD-10-CM

## 2013-12-12 DIAGNOSIS — Z1231 Encounter for screening mammogram for malignant neoplasm of breast: Secondary | ICD-10-CM

## 2014-01-04 ENCOUNTER — Encounter (HOSPITAL_COMMUNITY): Payer: Self-pay | Admitting: Emergency Medicine

## 2014-01-04 ENCOUNTER — Emergency Department (HOSPITAL_COMMUNITY)
Admission: EM | Admit: 2014-01-04 | Discharge: 2014-01-04 | Disposition: A | Discharge status: Home / Self Care | Payer: Medicaid Other | Attending: Emergency Medicine | Admitting: Emergency Medicine

## 2014-01-04 DIAGNOSIS — Z791 Long term (current) use of non-steroidal anti-inflammatories (NSAID): Secondary | ICD-10-CM | POA: Diagnosis not present

## 2014-01-04 DIAGNOSIS — Z9089 Acquired absence of other organs: Secondary | ICD-10-CM | POA: Insufficient documentation

## 2014-01-04 DIAGNOSIS — Z87828 Personal history of other (healed) physical injury and trauma: Secondary | ICD-10-CM | POA: Insufficient documentation

## 2014-01-04 DIAGNOSIS — Z862 Personal history of diseases of the blood and blood-forming organs and certain disorders involving the immune mechanism: Secondary | ICD-10-CM | POA: Insufficient documentation

## 2014-01-04 DIAGNOSIS — G8929 Other chronic pain: Secondary | ICD-10-CM | POA: Diagnosis not present

## 2014-01-04 DIAGNOSIS — E119 Type 2 diabetes mellitus without complications: Secondary | ICD-10-CM | POA: Insufficient documentation

## 2014-01-04 DIAGNOSIS — Z79899 Other long term (current) drug therapy: Secondary | ICD-10-CM | POA: Diagnosis not present

## 2014-01-04 DIAGNOSIS — R350 Frequency of micturition: Secondary | ICD-10-CM | POA: Diagnosis present

## 2014-01-04 DIAGNOSIS — M129 Arthropathy, unspecified: Secondary | ICD-10-CM | POA: Insufficient documentation

## 2014-01-04 DIAGNOSIS — J42 Unspecified chronic bronchitis: Secondary | ICD-10-CM | POA: Diagnosis not present

## 2014-01-04 DIAGNOSIS — E669 Obesity, unspecified: Secondary | ICD-10-CM | POA: Diagnosis not present

## 2014-01-04 DIAGNOSIS — Z8619 Personal history of other infectious and parasitic diseases: Secondary | ICD-10-CM | POA: Diagnosis not present

## 2014-01-04 DIAGNOSIS — Z7982 Long term (current) use of aspirin: Secondary | ICD-10-CM | POA: Insufficient documentation

## 2014-01-04 DIAGNOSIS — Z8659 Personal history of other mental and behavioral disorders: Secondary | ICD-10-CM | POA: Diagnosis not present

## 2014-01-04 DIAGNOSIS — F17200 Nicotine dependence, unspecified, uncomplicated: Secondary | ICD-10-CM | POA: Diagnosis not present

## 2014-01-04 DIAGNOSIS — Z8719 Personal history of other diseases of the digestive system: Secondary | ICD-10-CM | POA: Diagnosis not present

## 2014-01-04 DIAGNOSIS — Z3202 Encounter for pregnancy test, result negative: Secondary | ICD-10-CM | POA: Diagnosis not present

## 2014-01-04 DIAGNOSIS — R739 Hyperglycemia, unspecified: Secondary | ICD-10-CM

## 2014-01-04 LAB — PREGNANCY, URINE: Preg Test, Ur: NEGATIVE

## 2014-01-04 LAB — URINALYSIS, ROUTINE W REFLEX MICROSCOPIC
BILIRUBIN URINE: NEGATIVE
Glucose, UA: >1000 mg/dL — AB
Ketones, ur: NEGATIVE mg/dL
Leukocytes, UA: NEGATIVE
Nitrite: NEGATIVE
PROTEIN: NEGATIVE mg/dL
Specific Gravity, Urine: 1.037 — ABNORMAL HIGH (ref 1.005–1.030)
Urobilinogen, UA: 0.2 mg/dL (ref 0.0–1.0)
pH: 5.5 (ref 5.0–8.0)

## 2014-01-04 LAB — I-STAT CHEM 8, ED
BUN: 7 mg/dL (ref 6–23)
CALCIUM ION: 1.21 mmol/L (ref 1.12–1.23)
Chloride: 97 mEq/L (ref 96–112)
Creatinine, Ser: 0.7 mg/dL (ref 0.50–1.10)
Glucose, Bld: 334 mg/dL — ABNORMAL HIGH (ref 70–99)
HCT: 36 % (ref 36.0–46.0)
Hemoglobin: 12.2 g/dL (ref 12.0–15.0)
Potassium: 3.4 mEq/L — ABNORMAL LOW (ref 3.7–5.3)
Sodium: 137 mEq/L (ref 137–147)
TCO2: 28 mmol/L (ref 0–100)

## 2014-01-04 LAB — CBC WITH DIFFERENTIAL/PLATELET
BASOS PCT: 1 % (ref 0–1)
Basophils Absolute: 0 10*3/uL (ref 0.0–0.1)
EOS ABS: 0 10*3/uL (ref 0.0–0.7)
Eosinophils Relative: 0 % (ref 0–5)
HEMATOCRIT: 39 % (ref 36.0–46.0)
Hemoglobin: 13.4 g/dL (ref 12.0–15.0)
Lymphocytes Relative: 27 % (ref 12–46)
Lymphs Abs: 2 10*3/uL (ref 0.7–4.0)
MCH: 29.2 pg (ref 26.0–34.0)
MCHC: 34.4 g/dL (ref 30.0–36.0)
MCV: 85 fL (ref 78.0–100.0)
MONO ABS: 0.6 10*3/uL (ref 0.1–1.0)
MONOS PCT: 8 % (ref 3–12)
Neutro Abs: 4.9 10*3/uL (ref 1.7–7.7)
Neutrophils Relative %: 64 % (ref 43–77)
Platelets: 480 10*3/uL — ABNORMAL HIGH (ref 150–400)
RBC: 4.59 MIL/uL (ref 3.87–5.11)
RDW: 12.2 % (ref 11.5–15.5)
WBC: 7.6 10*3/uL (ref 4.0–10.5)

## 2014-01-04 LAB — COMPREHENSIVE METABOLIC PANEL
ALT: 26 U/L (ref 0–35)
ANION GAP: 16 — AB (ref 5–15)
AST: 18 U/L (ref 0–37)
Albumin: 3.8 g/dL (ref 3.5–5.2)
Alkaline Phosphatase: 117 U/L (ref 39–117)
BILIRUBIN TOTAL: 0.2 mg/dL — AB (ref 0.3–1.2)
BUN: 12 mg/dL (ref 6–23)
CHLORIDE: 84 meq/L — AB (ref 96–112)
CO2: 27 mEq/L (ref 19–32)
CREATININE: 0.67 mg/dL (ref 0.50–1.10)
Calcium: 10.3 mg/dL (ref 8.4–10.5)
GFR calc Af Amer: >90 mL/min (ref >90)
GFR calc non Af Amer: >90 mL/min (ref >90)
Glucose, Bld: 706 mg/dL (ref 70–99)
Potassium: 4.8 mEq/L (ref 3.7–5.3)
Sodium: 127 mEq/L — ABNORMAL LOW (ref 137–147)
TOTAL PROTEIN: 8.3 g/dL (ref 6.0–8.3)

## 2014-01-04 LAB — CBG MONITORING, ED
GLUCOSE-CAPILLARY: 223 mg/dL — AB (ref 70–99)
Glucose-Capillary: 452 mg/dL — ABNORMAL HIGH (ref 70–99)
Glucose-Capillary: 363 mg/dL — ABNORMAL HIGH (ref 70–99)

## 2014-01-04 LAB — URINE MICROSCOPIC-ADD ON

## 2014-01-04 LAB — HEMOGLOBIN A1C
HEMOGLOBIN A1C: 15 % — AB (ref <5.7)
Mean Plasma Glucose: 384 mg/dL — ABNORMAL HIGH (ref <117)

## 2014-01-04 MED ORDER — LANCET DEVICE MISC: Status: DC

## 2014-01-04 MED ORDER — INSULIN ASPART 100 UNIT/ML ~~LOC~~ SOLN
8.0000 [IU] | Freq: Once | SUBCUTANEOUS | Status: AC
  Administered 2014-01-04: 8 [IU] via INTRAVENOUS
  Filled 2014-01-04: qty 1

## 2014-01-04 MED ORDER — SODIUM CHLORIDE 0.9 % IV SOLN
1000.0000 mL | INTRAVENOUS | Status: DC
  Administered 2014-01-04: 1000 mL via INTRAVENOUS

## 2014-01-04 MED ORDER — INSULIN ASPART 100 UNIT/ML ~~LOC~~ SOLN
8.0000 [IU] | Freq: Once | SUBCUTANEOUS | Status: AC
Start: 2014-01-04 — End: 2014-01-04
  Administered 2014-01-04: 8 [IU] via INTRAVENOUS
  Filled 2014-01-04: qty 1

## 2014-01-04 MED ORDER — SODIUM CHLORIDE 0.9 % IV SOLN
1000.0000 mL | Freq: Once | INTRAVENOUS | Status: AC
  Administered 2014-01-04: 1000 mL via INTRAVENOUS

## 2014-01-04 MED ORDER — LIVING WELL WITH DIABETES BOOK
Freq: Once | Status: AC
Start: 2014-01-04 — End: 2014-01-04
  Administered 2014-01-04: 09:00:00
  Filled 2014-01-04: qty 1

## 2014-01-04 MED ORDER — BLOOD GLUC METER DISP-STRIPS DEVI: Status: DC

## 2014-01-04 MED ORDER — METFORMIN HCL 500 MG PO TABS: 500.0000 mg | ORAL_TABLET | Freq: Two times a day (BID) | ORAL | Status: DC

## 2014-01-04 MED ORDER — METFORMIN HCL 500 MG PO TABS
500.0000 mg | ORAL_TABLET | Freq: Once | ORAL | Status: AC
  Administered 2014-01-04: 500 mg via ORAL
  Filled 2014-01-04: qty 1

## 2014-01-04 MED ORDER — BLOOD GLUCOSE MONITOR SYSTEM W/DEVICE KIT: 1.0000 | PACK | Status: DC

## 2014-01-04 NOTE — ED Notes (Signed)
MD at bedside. 

## 2014-01-04 NOTE — Discharge Instructions (Signed)
Diabetes Mellitus and Food It is important for you to manage your blood sugar (glucose) level. Your blood glucose level can be greatly affected by what you eat. Eating healthier foods in the appropriate amounts throughout the day at about the same time each day will help you control your blood glucose level. It can also help slow or prevent worsening of your diabetes mellitus. Healthy eating may even help you improve the level of your blood pressure and reach or maintain a healthy weight.  HOW CAN FOOD AFFECT ME? Carbohydrates Carbohydrates affect your blood glucose level more than any other type of food. Your dietitian will help you determine how many carbohydrates to eat at each meal and teach you how to count carbohydrates. Counting carbohydrates is important to keep your blood glucose at a healthy level, especially if you are using insulin or taking certain medicines for diabetes mellitus. Alcohol Alcohol can cause sudden decreases in blood glucose (hypoglycemia), especially if you use insulin or take certain medicines for diabetes mellitus. Hypoglycemia can be a life-threatening condition. Symptoms of hypoglycemia (sleepiness, dizziness, and disorientation) are similar to symptoms of having too much alcohol.  If your health care provider has given you approval to drink alcohol, do so in moderation and use the following guidelines:  Women should not have more than one drink per day, and men should not have more than two drinks per day. One drink is equal to:  12 oz of beer.  5 oz of wine.  1 oz of hard liquor.  Do not drink on an empty stomach.  Keep yourself hydrated. Have water, diet soda, or unsweetened iced tea.  Regular soda, juice, and other mixers might contain a lot of carbohydrates and should be counted. WHAT FOODS ARE NOT RECOMMENDED? As you make food choices, it is important to remember that all foods are not the same. Some foods have fewer nutrients per serving than other  foods, even though they might have the same number of calories or carbohydrates. It is difficult to get your body what it needs when you eat foods with fewer nutrients. Examples of foods that you should avoid that are high in calories and carbohydrates but low in nutrients include:  Trans fats (most processed foods list trans fats on the Nutrition Facts label).  Regular soda.  Juice.  Candy.  Sweets, such as cake, pie, doughnuts, and cookies.  Fried foods. WHAT FOODS CAN I EAT? Have nutrient-rich foods, which will nourish your body and keep you healthy. The food you should eat also will depend on several factors, including:  The calories you need.  The medicines you take.  Your weight.  Your blood glucose level.  Your blood pressure level.  Your cholesterol level. You also should eat a variety of foods, including:  Protein, such as meat, poultry, fish, tofu, nuts, and seeds (lean animal proteins are best).  Fruits.  Vegetables.  Dairy products, such as milk, cheese, and yogurt (low fat is best).  Breads, grains, pasta, cereal, rice, and beans.  Fats such as olive oil, trans fat-free margarine, canola oil, avocado, and olives. DOES EVERYONE WITH DIABETES MELLITUS HAVE THE SAME MEAL PLAN? Because every person with diabetes mellitus is different, there is not one meal plan that works for everyone. It is very important that you meet with a dietitian who will help you create a meal plan that is just right for you. Document Released: 02/03/2005 Document Revised: 05/14/2013 Document Reviewed: 04/05/2013 [employer] Patient Information 2015 [employer]. This   information is not intended to replace advice given to you by your health care provider. Make sure you discuss any questions you have with your health care provider.  Daily Diabetes Record Check your blood glucose (BG) as directed by your health care provider. Use this form to record your results as well as any diabetes  medicines you take, including insulin. Checking your BG, recording it, and bringing your records to your health care provider is very helpful in managing your diabetes. These numbers help your health care provider know if any changes are needed to your diabetes plan.  Week of _____________________________ Date: _________  Elita Boone, BG/Medicines: ________________ / __________________________________________________________  LUNCH, BG/Medicines: ____________________ / __________________________________________________________  Wonda Cheng, BG/Medicines: ___________________ / __________________________________________________________  BEDTIME, BG/Medicines: __________________ / __________________________________________________________ Date: _________  Elita Boone, BG/Medicines: ________________ / __________________________________________________________  LUNCH, BG/Medicines: ____________________ / __________________________________________________________  Wonda Cheng, BG/Medicines: ___________________ / __________________________________________________________  BEDTIME, BG/Medicines: __________________ / __________________________________________________________ Date: _________  Elita Boone, BG/Medicines: ________________ / __________________________________________________________  LUNCH, BG/Medicines: ____________________ / __________________________________________________________  Wonda Cheng, BG/Medicines: ___________________ / __________________________________________________________  BEDTIME, BG/Medicines: __________________ / __________________________________________________________ Date: _________  Elita Boone, BG/Medicines: ________________ / __________________________________________________________  LUNCH, BG/Medicines: ____________________ / __________________________________________________________  Wonda Cheng, BG/Medicines: ___________________ /  __________________________________________________________  BEDTIME, BG/Medicines: __________________ / __________________________________________________________ Date: _________  Elita Boone, BG/Medicines: ________________ / __________________________________________________________  LUNCH, BG/Medicines: ____________________ / __________________________________________________________  Wonda Cheng, BG/Medicines: ___________________ / __________________________________________________________  BEDTIME, BG/Medicines: __________________ / __________________________________________________________ Date: _________  Elita Boone, BG/Medicines: ________________ / __________________________________________________________  LUNCH, BG/Medicines: ____________________ / __________________________________________________________  Wonda Cheng, BG/Medicines: ___________________ / __________________________________________________________  BEDTIME, BG/Medicines: __________________ / __________________________________________________________ Date: _________  Elita Boone, BG/Medicines: ________________ / __________________________________________________________  LUNCH, BG/Medicines: ____________________ / __________________________________________________________  Wonda Cheng, BG/Medicines: ___________________ / __________________________________________________________  BEDTIME, BG/Medicines: __________________ / __________________________________________________________ Notes: __________________________________________________________________________________________________ Document Released: 04/12/2004 Document Revised: 09/23/2013 Document Reviewed: 07/03/2013 [employer] Patient Information 2015 [employer]. This information is not intended to replace advice given to you by your health care provider. Make sure you discuss any questions you have with your health care provider.  Diabetes and Standards of Medical  Care Diabetes is complicated. You may find that your diabetes team includes a dietitian, nurse, diabetes educator, eye doctor, and more. To help everyone know what is going on and to help you get the care you deserve, the following schedule of care was developed to help keep you on track. Below are the tests, exams, vaccines, medicines, education, and plans you will need. HbA1c test This test shows how well you have controlled your glucose over the past 2-3 months. It is used to see if your diabetes management plan needs to be adjusted.   It is performed at least 2 times a year if you are meeting treatment goals.  It is performed 4 times a year if therapy has changed or if you are not meeting treatment goals. Blood pressure test  This test is performed at every routine medical visit. The goal is less than 140/90 mm Hg for most people, but 130/80 mm Hg in some cases. Ask your health care provider about your goal. Dental exam  Follow up with the dentist regularly. Eye exam  If you are diagnosed with type 1 diabetes as a child, get an exam upon reaching the age of 12 years or older and have had diabetes for 3-5 years. Yearly eye exams are recommended after that initial eye exam.  If you are diagnosed with type 1 diabetes as an adult, get an exam within 5 years of diagnosis and then yearly.  If you are diagnosed with type 2 diabetes, get an exam as soon  as possible after the diagnosis and then yearly. Foot care exam  Visual foot exams are performed at every routine medical visit. The exams check for cuts, injuries, or other problems with the feet.  A comprehensive foot exam should be done yearly. This includes visual inspection as well as assessing foot pulses and testing for loss of sensation.  Check your feet nightly for cuts, injuries, or other problems with your feet. Tell your health care provider if anything is not healing. Kidney function test (urine microalbumin)  This test is  performed once a year.  Type 1 diabetes: The first test is performed 5 years after diagnosis.  Type 2 diabetes: The first test is performed at the time of diagnosis.  A serum creatinine and estimated glomerular filtration rate (eGFR) test is done once a year to assess the level of chronic kidney disease (CKD), if present. Lipid profile (cholesterol, HDL, LDL, triglycerides)  Performed every 5 years for most people.  The goal for LDL is less than 100 mg/dL. If you are at high risk, the goal is less than 70 mg/dL.  The goal for HDL is 40 mg/dL-50 mg/dL for men and 50 mg/dL-60 mg/dL for women. An HDL cholesterol of 60 mg/dL or higher gives some protection against heart disease.  The goal for triglycerides is less than 150 mg/dL. Influenza vaccine, pneumococcal vaccine, and hepatitis B vaccine  The influenza vaccine is recommended yearly.  It is recommended that people with diabetes who are over 48 years old get the pneumonia vaccine. In some cases, two separate shots may be given. Ask your health care provider if your pneumonia vaccination is up to date.  The hepatitis B vaccine is also recommended for adults with diabetes. Diabetes self-management education  Education is recommended at diagnosis and ongoing as needed. Treatment plan  Your treatment plan is reviewed at every medical visit. Document Released: 03/06/2009 Document Revised: 09/23/2013 Document Reviewed: 10/09/2012 [facility] Patient Information 2015 [place], [place]. This information is not intended to replace advice given to you by your health care provider. Make sure you discuss any questions you have with your health care provider.  How to Avoid Diabetes Problems You can do a lot to prevent or slow down diabetes problems. Following your diabetes plan and taking care of yourself can reduce your risk of serious or life-threatening complications. Below, you will find certain things you can do to prevent diabetes  problems. MANAGE YOUR DIABETES Follow your health care provider's, nurse educator's, and dietitian's instructions for managing your diabetes. They will teach you the basics of diabetes care. They can help answer questions you may have. Learn about diabetes and make healthy choices regarding eating and physical activity. Monitor your blood glucose level regularly. Your health care provider will help you decide how often to check your blood glucose level depending on your treatment goals and how well you are meeting them.  DO NOT USE NICOTINE Nicotine and diabetes are a dangerous combination. Nicotine raises your risk for diabetes problems. If you quit using nicotine, you will lower your risk for heart attack, stroke, nerve disease, and kidney disease. Your cholesterol and your blood pressure levels may improve. Your blood circulation will also improve. Do not use any tobacco products, including cigarettes, chewing tobacco, or electronic cigarettes. If you need help quitting, ask your health care provider. KEEP YOUR BLOOD PRESSURE UNDER CONTROL Keeping your blood pressure under control will help prevent damage to your eyes, kidneys, heart, and blood vessels. Blood pressure consists of two  numbers. The top number should be below 120, and the bottom number should be below 80 (120/80). Keep your blood pressure as close to these numbers as you can. If you already have kidney disease, you may want even lower blood pressure to protect your kidneys. Talk to your health care provider to make sure that your blood pressure goal is right for your needs. Meal planning, medicines, and exercise can help you reach your blood pressure target. Have your blood pressure checked at every visit with your health care provider. KEEP YOUR CHOLESTEROL UNDER CONTROL Normal cholesterol levels will help prevent heart disease and stroke. These are the biggest health problems for people with diabetes. Keeping cholesterol levels under  control can also help with blood flow. Have your cholesterol level checked at least once a year. Your health care provider may prescribe a medicine known as a statin. Statins lower your cholesterol. If you are not taking a statin, ask your health care provider if you should be. Meal planning, exercise, and medicines can help you reach your cholesterol targets.  SCHEDULE AND KEEP YOUR ANNUAL PHYSICAL EXAMS AND EYE EXAMS Your health care provider will tell you how often he or she wants to see you depending on your plan of treatment. It is important that you keep these appointments so that possible problems can be identified early and complications can be avoided or treated.  Every visit with your health care provider should include your weight, blood pressure, and an evaluation of your blood glucose control.  Your hemoglobin A1c should be checked:  At least twice a year if you are at your goal.  Every 3 months if there are changes in treatment.  If you are not meeting your goals.  Your blood lipids should be checked yearly. You should also be checked yearly to see if you have protein in your urine (microalbumin).  Schedule a dilated eye exam within 5 years of your diagnosis if you have type 1 diabetes, and then yearly. Schedule a dilated eye exam at diagnosis if you have type 2 diabetes, and then yearly. All exams thereafter can be extended to every 2 to 3 years if one or more exams have been normal. KEEP YOUR VACCINES CURRENT The flu vaccine is recommended yearly. The formula for the vaccine changes every year and needs to be updated for the best protection against current viruses. It is recommended that people with diabetes who are over 18 years old get the pneumonia vaccine. In some cases, two separate shots may be given. Ask your health care provider if your pneumonia vaccination is up-to-date. However, there are some instances where another vaccine is recommended. Check with your health care  provider. TAKE CARE OF YOUR FEET  Diabetes may cause you to have a poor blood supply (circulation) to your legs and feet. Because of this, the skin may be thinner, break easier, and heal more slowly. You also may have nerve damage in your legs and feet, causing decreased feeling. You may not notice minor injuries to your feet that could lead to serious problems or infections. Taking care of your feet is very important. Visual foot exams are performed at every routine medical visit. The exams check for cuts, injuries, or other problems with the feet. A comprehensive foot exam should be done yearly. This includes visual inspection as well as assessing foot pulses and testing for loss of sensation. You should also do the following:  Inspect your feet daily for cuts, calluses, blisters, ingrown toenails,  and signs of infection, such as redness, swelling, or pus.  Wash and dry your feet thoroughly, especially between the toes.  Avoid soaking your feet regularly in hot water baths.  Moisturize dry skin with lotion, avoiding areas between your toes.  Cut toenails straight across and file the edges.  Avoid shoes that do not fit well or have areas that irritate your skin.  Avoid going barefooted or wearing only socks. Your feet need protection. TAKE CARE OF YOUR TEETH People with poorly controlled diabetes are more likely to have gum (periodontal) disease. These infections make diabetes harder to control. Periodontal diseases, if left untreated, can lead to tooth loss. Brush your teeth twice a day, floss, and see your dentist for checkups and cleaning every 6 months, or 2 times a year. ASK YOUR HEALTH CARE PROVIDER ABOUT TAKING ASPIRIN Taking aspirin daily is recommended to help prevent cardiovascular disease in people with and without diabetes. Ask your health care provider if this would benefit you and what dose he or she would recommend. DRINK RESPONSIBLY Moderate amounts of alcohol (less than 1  drink per day for adult women and less than 2 drinks per day for adult men) have a minimal effect on blood glucose if ingested with food. It is important to eat food with alcohol to avoid hypoglycemia. People should avoid alcohol if they have a history of alcohol abuse or dependence, if they are pregnant, and if they have liver disease, pancreatitis, advanced neuropathy, or severe hypertriglyceridemia. LESSEN STRESS Living with diabetes can be stressful. When you are under stress, your blood glucose may be affected in two ways:  Stress hormones may cause your blood glucose to rise.  You may be distracted from taking good care of yourself. It is a good idea to be aware of your stress level and make changes that are necessary to help you better manage challenging situations. Support groups, planned relaxation, a hobby you enjoy, meditation, healthy relationships, and exercise all work to lower your stress level. If your efforts do not seem to be helping, get help from your health care provider or a trained mental health professional. Document Released: 01/25/2011 Document Revised: 09/23/2013 Document Reviewed: 07/03/2013 [facility] Patient Information 2015 [place], [place]. This information is not intended to replace advice given to you by your health care provider. Make sure you discuss any questions you have with your health care provider.  Type 2 Diabetes Mellitus Type 2 diabetes mellitus, often simply referred to as type 2 diabetes, is a long-lasting (chronic) disease. In type 2 diabetes, the pancreas does not make enough insulin (a hormone), the cells are less responsive to the insulin that is made (insulin resistance), or both. Normally, insulin moves sugars from food into the tissue cells. The tissue cells use the sugars for energy. The lack of insulin or the lack of normal response to insulin causes excess sugars to build up in the blood instead of going into the tissue cells. As a result, high blood  sugar (hyperglycemia) develops. The effect of high sugar (glucose) levels can cause many complications. Type 2 diabetes was also previously called adult-onset diabetes, but it can occur at any age.  RISK FACTORS  A person is predisposed to developing type 2 diabetes if someone in the family has the disease and also has one or more of the following primary risk factors:  Overweight.  An inactive lifestyle.  A history of consistently eating high-calorie foods. Maintaining a normal weight and regular physical activity can reduce the  chance of developing type 2 diabetes. SYMPTOMS  A person with type 2 diabetes may not show symptoms initially. The symptoms of type 2 diabetes appear slowly. The symptoms include:  Increased thirst (polydipsia).  Increased urination (polyuria).  Increased urination during the night (nocturia).  Weight loss. This weight loss may be rapid.  Frequent, recurring infections.  Tiredness (fatigue).  Weakness.  Vision changes, such as blurred vision.  Fruity smell to your breath.  Abdominal pain.  Nausea or vomiting.  Cuts or bruises which are slow to heal.  Tingling or numbness in the hands or feet. DIAGNOSIS Type 2 diabetes is frequently not diagnosed until complications of diabetes are present. Type 2 diabetes is diagnosed when symptoms or complications are present and when blood glucose levels are increased. Your blood glucose level may be checked by one or more of the following blood tests:  A fasting blood glucose test. You will not be allowed to eat for at least 8 hours before a blood sample is taken.  A random blood glucose test. Your blood glucose is checked at any time of the day regardless of when you ate.  A hemoglobin A1c blood glucose test. A hemoglobin A1c test provides information about blood glucose control over the previous 3 months.  An oral glucose tolerance test (OGTT). Your blood glucose is measured after you have not eaten  (fasted) for 2 hours and then after you drink a glucose-containing beverage. TREATMENT   You may need to take insulin or diabetes medicine daily to keep blood glucose levels in the desired range.  If you use insulin, you may need to adjust the dosage depending on the carbohydrates that you eat with each meal or snack. The treatment goal is to maintain the before meal blood sugar (preprandial glucose) level at 70-130 mg/dL. HOME CARE INSTRUCTIONS   Have your hemoglobin A1c level checked twice a year.  Perform daily blood glucose monitoring as directed by your health care provider.  Monitor urine ketones when you are ill and as directed by your health care provider.  Take your diabetes medicine or insulin as directed by your health care provider to maintain your blood glucose levels in the desired range.  Never run out of diabetes medicine or insulin. It is needed every day.  If you are using insulin, you may need to adjust the amount of insulin given based on your intake of carbohydrates. Carbohydrates can raise blood glucose levels but need to be included in your diet. Carbohydrates provide vitamins, minerals, and fiber which are an essential part of a healthy diet. Carbohydrates are found in fruits, vegetables, whole grains, dairy products, legumes, and foods containing added sugars.  Eat healthy foods. You should make an appointment to see a registered dietitian to help you create an eating plan that is right for you.  Lose weight if you are overweight.  Carry a medical alert card or wear your medical alert jewelry.  Carry a 15-gram carbohydrate snack with you at all times to treat low blood glucose (hypoglycemia). Some examples of 15-gram carbohydrate snacks include:  Glucose tablets, 3 or 4.  Glucose gel, 15-gram tube.  Raisins, 2 tablespoons (24 grams).  Jelly beans, 6.  Animal crackers, 8.  Regular pop, 4 ounces (120 mL).  Gummy treats, 9.  Recognize hypoglycemia.  Hypoglycemia occurs with blood glucose levels of 70 mg/dL and below. The risk for hypoglycemia increases when fasting or skipping meals, during or after intense exercise, and during sleep. Hypoglycemia symptoms can include:  Tremors or shakes.  Decreased ability to concentrate.  Sweating.  Increased heart rate.  Headache.  Dry mouth.  Hunger.  Irritability.  Anxiety.  Restless sleep.  Altered speech or coordination.  Confusion.  Treat hypoglycemia promptly. If you are alert and able to safely swallow, follow the 15:15 rule:  Take 15-20 grams of rapid-acting glucose or carbohydrate. Rapid-acting options include glucose gel, glucose tablets, or 4 ounces (120 mL) of fruit juice, regular soda, or low-fat milk.  Check your blood glucose level 15 minutes after taking the glucose.  Take 15-20 grams more of glucose if the repeat blood glucose level is still 70 mg/dL or below.  Eat a meal or snack within 1 hour once blood glucose levels return to normal.  Be alert to feeling very thirsty and urinating more frequently than usual, which are early signs of hyperglycemia. An early awareness of hyperglycemia allows for prompt treatment. Treat hyperglycemia as directed by your health care provider.  Engage in at least 150 minutes of moderate-intensity physical activity a week, spread over at least 3 days of the week or as directed by your health care provider. In addition, you should engage in resistance exercise at least 2 times a week or as directed by your health care provider. Try to spend no more than 90 minutes at one time inactive.  Adjust your medicine and food intake as needed if you start a new exercise or sport.  Follow your sick-day plan anytime you are unable to eat or drink as usual.  Do not use any tobacco products including cigarettes, chewing tobacco, or electronic cigarettes. If you need help quitting, ask your health care provider.  Limit alcohol intake to no more  than 1 drink per day for nonpregnant women and 2 drinks per day for men. You should drink alcohol only when you are also eating food. Talk with your health care provider whether alcohol is safe for you. Tell your health care provider if you drink alcohol several times a week.  Keep all follow-up visits as directed by your health care provider. This is important.  Schedule an eye exam soon after the diagnosis of type 2 diabetes and then annually.  Perform daily skin and foot care. Examine your skin and feet daily for cuts, bruises, redness, nail problems, bleeding, blisters, or sores. A foot exam by a health care provider should be done annually.  Brush your teeth and gums at least twice a day and floss at least once a day. Follow up with your dentist regularly.  Share your diabetes management plan with your workplace or school.  Stay up-to-date with immunizations. It is recommended that people with diabetes who are over 36 years old get the pneumonia vaccine. In some cases, two separate shots may be given. Ask your health care provider if your pneumonia vaccination is up-to-date.  Learn to manage stress.  Obtain ongoing diabetes education and support as needed.  Participate in or seek rehabilitation as needed to maintain or improve independence and quality of life. Request a physical or occupational therapy referral if you are having foot or hand numbness, or difficulties with grooming, dressing, eating, or physical activity. SEEK MEDICAL CARE IF:   You are unable to eat food or drink fluids for more than 6 hours.  You have nausea and vomiting for more than 6 hours.  Your blood glucose level is over 240 mg/dL.  There is a change in mental status.  You develop an additional serious illness.  You have  diarrhea for more than 6 hours.  You have been sick or have had a fever for a couple of days and are not getting better.  You have pain during any physical activity.  SEEK IMMEDIATE  MEDICAL CARE IF:  You have difficulty breathing.  You have moderate to large ketone levels. MAKE SURE YOU:  Understand these instructions.  Will watch your condition.  Will get help right away if you are not doing well or get worse. Document Released: 05/09/2005 Document Revised: 09/23/2013 Document Reviewed: 12/06/2011 [facility] Patient Information 2015 [place], [place]. This information is not intended to replace advice given to you by your health care provider. Make sure you discuss any questions you have with your health care provider.

## 2014-01-04 NOTE — ED Provider Notes (Signed)
CSN: [id number]     Arrival date & time 01/04/14  0040 History   First MD Initiated Contact with Patient 01/04/14 0329     Chief Complaint  Patient presents with  . Urinary Frequency     (Consider location/radiation/quality/duration/timing/severity/associated sxs/prior Treatment) HPI 58 year old female presents to emergency department from home with complaint of 2 weeks of increasing thirst, increased urination and craving sweets liquids.  Patient denies history of diabetes.  She reports that [facility].  She was told she had some abnormal labs and was supposed to return for a reeval visit.  She reports she was unable to make this trip, and has been unable to followup since that time.  She denies any chest pain, shortness of breath, nausea vomiting or diarrhea.  She does not have a previous history of diabetes. Past Medical History  Diagnosis Date  . Fibroid (bleeding) (uterine)   . RLS (restless legs syndrome)   . Insomnia   . Anxiety   . Environmental allergies     ALLERGIES AND SINUS PROBLEMS  . Fall 07/10/13    FX'D LEFT ANKLE - DID HAVE A LOT OF SORENESS NECK AND RIGHT SHOULDER-BUT IMPROVING.; STATES FRACTURED ANKLE IS SPLINTED AND USING CRUTCHES AT HOME  . History of shingles 2010    RIGHT SIDE OF BACK - STILL HAS SENSATIONS OF MUSCLE CONTRACTIONS OR "LIGHTNING BOLT" SENSATION IN THAT AREA  . GERD (gastroesophageal reflux disease)     WITH CERTAIN FOODS -WOULD TAKE OTC MED IF NEEDED  . Chronic bronchitis     "at least once/yr; more lately since I moved here from [place]" (07/18/2013)  . Anemia     "when I was little"  . Sinus headache   . Arthritis     "elbows; probably knees too" (07/18/2013)  . Sciatica   . Chronic upper back pain   . Depression    Past Surgical History  Procedure Laterality Date  . Orif ankle fracture Left 07/17/2013    "I fell on 07/10/2013"  . Cholecystectomy  1990's  . Tubal ligation  1990's  . Orif ankle  fracture Left 07/17/2013    Procedure: OPEN REDUCTION INTERNAL FIXATION (ORIF) ANKLE FRACTURE;  Surgeon: Marianna Payment, MD;  Location: [facility];  Service: Orthopedics;  Laterality: Left;   Family History  Problem Relation Age of Onset  . Cancer Mother   . Diabetes Mother   . Hypertension Father   . Heart disease Father   . Diabetes Sister   . Hyperlipidemia Sister   . Hypertension Sister    History  Substance Use Topics  . Smoking status: Current Every Day Smoker -- 0.50 packs/day for 30 years    Types: Cigarettes    Last Attempt to Quit: 07/10/2013  . Smokeless tobacco: Never Used  . Alcohol Use: Yes     Comment: 07/18/2013 "couple times/yr I'll have a drink; use to drink more than that"   OB History   Grav Para Term Preterm Abortions TAB SAB Ect Mult Living                 Review of Systems   See History of Present Illness; otherwise all other systems are reviewed and negative  Allergies  Review of patient's allergies indicates no known allergies.  Home Medications   Prior to Admission medications   Medication Sig Start Date End Date Taking? Authorizing Provider  albuterol (PROVENTIL HFA;VENTOLIN HFA) 108 (90 BASE) MCG/ACT inhaler Inhale  into the lungs every 6 (six) hours as needed for wheezing or shortness of breath.   Yes Historical Provider, MD  aspirin EC 325 MG tablet Take 325 mg by mouth daily.   Yes Historical Provider, MD  cetirizine (ZYRTEC) 10 MG tablet Take 10 mg by mouth daily as needed for allergies.    Yes Historical Provider, MD  cholecalciferol (VITAMIN D) 1000 UNITS tablet Take 1,000 Units by mouth daily.   Yes Historical Provider, MD  cyclobenzaprine (FLEXERIL) 10 MG tablet Take 10 mg by mouth 3 (three) times daily as needed for muscle spasms.   Yes Historical Provider, MD  diclofenac (VOLTAREN) 75 MG EC tablet Take 75 mg by mouth 2 (two) times daily as needed for mild pain.   Yes Historical Provider, MD  diphenhydramine-acetaminophen (TYLENOL PM)  25-500 MG TABS Take 2 tablets by mouth daily as needed (sleep).   Yes Historical Provider, MD  fenofibrate (TRICOR) 145 MG tablet Take 145 mg by mouth daily.   Yes Historical Provider, MD  fluticasone (FLONASE) 50 MCG/ACT nasal spray Place 2 sprays into the nose daily as needed for allergies or rhinitis. PRN 08/10/12  Yes Carvel Getting, NP  ibuprofen (ADVIL,MOTRIN) 200 MG tablet Take 400 mg by mouth every 6 (six) hours as needed for moderate pain.   Yes Historical Provider, MD   BP 154/94  Pulse 94  Temp(Src) 98.5 F (36.9 C) (Oral)  Resp 23  Ht 5\' 4"  (1.626 m)  Wt 255 lb (115.667 kg)  BMI 43.75 kg/m2  SpO2 97%  LMP 01/02/2014 Physical Exam  Nursing note and vitals reviewed. Constitutional: She is oriented to person, place, and time. She appears well-developed and well-nourished.  Obese female, no acute distress  HENT:  Head: Normocephalic and atraumatic.  Nose: Nose normal.  Dry mucous membranes  Eyes: Conjunctivae and EOM are normal. Pupils are equal, round, and reactive to light.  Neck: Normal range of motion. Neck supple. No JVD present. No tracheal deviation present. No thyromegaly present.  Cardiovascular: Normal rate, regular rhythm, normal heart sounds and intact distal pulses.  Exam reveals no gallop and no friction rub.   No murmur heard. Pulmonary/Chest: Effort normal and breath sounds normal. No stridor. No respiratory distress. She has no wheezes. She has no rales. She exhibits no tenderness.  Abdominal: Soft. Bowel sounds are normal. She exhibits no distension and no mass. There is no tenderness. There is no rebound and no guarding.  Musculoskeletal: Normal range of motion. She exhibits no edema and no tenderness.  Lymphadenopathy:    She has no cervical adenopathy.  Neurological: She is alert and oriented to person, place, and time. She exhibits normal muscle tone. Coordination normal.  Skin: Skin is warm and dry. No rash noted. No erythema. No pallor.  Psychiatric:  She has a normal mood and affect. Her behavior is normal. Judgment and thought content normal.    ED Course  Procedures (including critical care time) Labs Review Labs Reviewed  URINALYSIS, ROUTINE W REFLEX MICROSCOPIC - Abnormal; Notable for the following:    Specific Gravity, Urine 1.037 (*)    Glucose, UA >1000 (*)    Hgb urine dipstick SMALL (*)    All other components within normal limits  CBC WITH DIFFERENTIAL - Abnormal; Notable for the following:    Platelets 480 (*)    All other components within normal limits  COMPREHENSIVE METABOLIC PANEL - Abnormal; Notable for the following:    Sodium 127 (*)    Chloride 84 (*)  Glucose, Bld 706 (*)    Total Bilirubin 0.2 (*)    Anion gap 16 (*)    All other components within normal limits  CBG MONITORING, ED - Abnormal; Notable for the following:    Glucose-Capillary 452 (*)    All other components within normal limits  CBG MONITORING, ED - Abnormal; Notable for the following:    Glucose-Capillary 363 (*)    All other components within normal limits  I-STAT CHEM 8, ED - Abnormal; Notable for the following:    Potassium 3.4 (*)    Glucose, Bld 334 (*)    All other components within normal limits  PREGNANCY, URINE  URINE MICROSCOPIC-ADD ON  HEMOGLOBIN A1C   in her  Imaging Review No results found.   EKG Interpretation None      MDM   Final diagnoses:  Hyperglycemia  Diabetes mellitus, new onset    58 year old female with diabetes.  She has significantly elevated goes with pseudohyponatremia.  Suspect type 2 diabetes.  Plan for IV fluids and IV insulin, will recheck blood sugar.  Patient will equal to followup early this [facility]    Kalman Drape, MD 01/04/14 [phone]

## 2014-01-04 NOTE — ED Notes (Signed)
The p;t is c/o [place] frequency drinking more liquids than usual craving more sugar than usual.  Headache that will not stop hurting.  lmp  w2 days ago

## 2014-01-06 ENCOUNTER — Emergency Department (HOSPITAL_COMMUNITY)
Admission: EM | Admit: 2014-01-06 | Discharge: 2014-01-07 | Disposition: A | Discharge status: Home / Self Care | Payer: Medicaid Other | Attending: Emergency Medicine | Admitting: Emergency Medicine

## 2014-01-06 ENCOUNTER — Encounter (HOSPITAL_COMMUNITY): Payer: Self-pay | Admitting: Emergency Medicine

## 2014-01-06 DIAGNOSIS — Z8719 Personal history of other diseases of the digestive system: Secondary | ICD-10-CM | POA: Diagnosis not present

## 2014-01-06 DIAGNOSIS — Z8669 Personal history of other diseases of the nervous system and sense organs: Secondary | ICD-10-CM | POA: Insufficient documentation

## 2014-01-06 DIAGNOSIS — J4531 Mild persistent asthma with (acute) exacerbation: Secondary | ICD-10-CM

## 2014-01-06 DIAGNOSIS — G8929 Other chronic pain: Secondary | ICD-10-CM | POA: Insufficient documentation

## 2014-01-06 DIAGNOSIS — Z87891 Personal history of nicotine dependence: Secondary | ICD-10-CM | POA: Insufficient documentation

## 2014-01-06 DIAGNOSIS — IMO0001 Reserved for inherently not codable concepts without codable children: Secondary | ICD-10-CM | POA: Insufficient documentation

## 2014-01-06 DIAGNOSIS — Z79899 Other long term (current) drug therapy: Secondary | ICD-10-CM | POA: Insufficient documentation

## 2014-01-06 DIAGNOSIS — Z7982 Long term (current) use of aspirin: Secondary | ICD-10-CM | POA: Insufficient documentation

## 2014-01-06 DIAGNOSIS — E1165 Type 2 diabetes mellitus with hyperglycemia: Secondary | ICD-10-CM

## 2014-01-06 DIAGNOSIS — Z8619 Personal history of other infectious and parasitic diseases: Secondary | ICD-10-CM | POA: Diagnosis not present

## 2014-01-06 DIAGNOSIS — Z8659 Personal history of other mental and behavioral disorders: Secondary | ICD-10-CM | POA: Diagnosis not present

## 2014-01-06 DIAGNOSIS — E119 Type 2 diabetes mellitus without complications: Secondary | ICD-10-CM | POA: Diagnosis present

## 2014-01-06 DIAGNOSIS — IMO0002 Reserved for concepts with insufficient information to code with codable children: Secondary | ICD-10-CM | POA: Diagnosis not present

## 2014-01-06 DIAGNOSIS — M129 Arthropathy, unspecified: Secondary | ICD-10-CM | POA: Insufficient documentation

## 2014-01-06 DIAGNOSIS — Z862 Personal history of diseases of the blood and blood-forming organs and certain disorders involving the immune mechanism: Secondary | ICD-10-CM | POA: Insufficient documentation

## 2014-01-06 DIAGNOSIS — R739 Hyperglycemia, unspecified: Secondary | ICD-10-CM

## 2014-01-06 DIAGNOSIS — J45901 Unspecified asthma with (acute) exacerbation: Secondary | ICD-10-CM | POA: Insufficient documentation

## 2014-01-06 DIAGNOSIS — Z8742 Personal history of other diseases of the female genital tract: Secondary | ICD-10-CM | POA: Insufficient documentation

## 2014-01-06 LAB — URINALYSIS, ROUTINE W REFLEX MICROSCOPIC
Bilirubin Urine: NEGATIVE
Glucose, UA: >1000 mg/dL — AB
Ketones, ur: 15 mg/dL — AB
NITRITE: NEGATIVE
PH: 5 (ref 5.0–8.0)
Protein, ur: NEGATIVE mg/dL
Specific Gravity, Urine: 1.041 — ABNORMAL HIGH (ref 1.005–1.030)
Urobilinogen, UA: 0.2 mg/dL (ref 0.0–1.0)

## 2014-01-06 LAB — COMPREHENSIVE METABOLIC PANEL
ALBUMIN: 3.7 g/dL (ref 3.5–5.2)
ALT: 21 U/L (ref 0–35)
ANION GAP: 12 (ref 5–15)
AST: 19 U/L (ref 0–37)
Alkaline Phosphatase: 93 U/L (ref 39–117)
BUN: 9 mg/dL (ref 6–23)
CO2: 26 mEq/L (ref 19–32)
CREATININE: 0.62 mg/dL (ref 0.50–1.10)
Calcium: 10 mg/dL (ref 8.4–10.5)
Chloride: 95 mEq/L — ABNORMAL LOW (ref 96–112)
GFR calc Af Amer: >90 mL/min (ref >90)
GFR calc non Af Amer: >90 mL/min (ref >90)
Glucose, Bld: 304 mg/dL — ABNORMAL HIGH (ref 70–99)
Potassium: 4.5 mEq/L (ref 3.7–5.3)
Sodium: 133 mEq/L — ABNORMAL LOW (ref 137–147)
TOTAL PROTEIN: 8.2 g/dL (ref 6.0–8.3)
Total Bilirubin: 0.3 mg/dL (ref 0.3–1.2)

## 2014-01-06 LAB — URINE MICROSCOPIC-ADD ON

## 2014-01-06 LAB — CBC
HEMATOCRIT: 40.4 % (ref 36.0–46.0)
Hemoglobin: 14.2 g/dL (ref 12.0–15.0)
MCH: 29.8 pg (ref 26.0–34.0)
MCHC: 35.1 g/dL (ref 30.0–36.0)
MCV: 84.7 fL (ref 78.0–100.0)
Platelets: 431 10*3/uL — ABNORMAL HIGH (ref 150–400)
RBC: 4.77 MIL/uL (ref 3.87–5.11)
RDW: 12.1 % (ref 11.5–15.5)
WBC: 6.6 10*3/uL (ref 4.0–10.5)

## 2014-01-06 NOTE — ED Notes (Signed)
Patient here with complaint of shortness of breath which began today while cooking. Patient was recently here with complaint of hyperglycemia and polyuria. States that the ED helped her with the blood glucose at that time, but she hasn't been able to control it at home. States that this is new onset diabetes.

## 2014-01-07 ENCOUNTER — Emergency Department (HOSPITAL_COMMUNITY): Payer: Medicaid Other

## 2014-01-07 LAB — CBG MONITORING, ED
Glucose-Capillary: 246 mg/dL — ABNORMAL HIGH (ref 70–99)
Glucose-Capillary: 294 mg/dL — ABNORMAL HIGH (ref 70–99)

## 2014-01-07 LAB — TROPONIN I: Troponin I: <0.3 ng/mL (ref <0.30)

## 2014-01-07 MED ORDER — METFORMIN HCL 500 MG PO TABS: 1000.0000 mg | ORAL_TABLET | Freq: Two times a day (BID) | ORAL | Status: DC

## 2014-01-07 MED ORDER — SODIUM CHLORIDE 0.9 % IV SOLN: 1000.0000 mL | INTRAVENOUS | Status: DC

## 2014-01-07 MED ORDER — ALBUTEROL SULFATE HFA 108 (90 BASE) MCG/ACT IN AERS
1.0000 | INHALATION_SPRAY | RESPIRATORY_TRACT | Status: DC | PRN
  Filled 2014-01-07: qty 6.7

## 2014-01-07 MED ORDER — SODIUM CHLORIDE 0.9 % IV SOLN
1000.0000 mL | Freq: Once | INTRAVENOUS | Status: AC
  Administered 2014-01-07: 1000 mL via INTRAVENOUS

## 2014-01-07 NOTE — Discharge Instructions (Signed)
Increase your metformin to two pills twice a day.  CALL YOUR DOCTOR IN THE MORNING TO HAVE FOLLOW UP APPOINTMENT SET UP!!   Asthma Asthma is a condition of the lungs in which the airways tighten and narrow. Asthma can make it hard to breathe. Asthma cannot be cured, but medicine and lifestyle changes can help control it. Asthma may be started (triggered) by:  Animal skin flakes (dander).  Dust.  Cockroaches.  Pollen.  Mold.  Smoke.  Cleaning products.  Hair sprays or aerosol sprays.  Paint fumes or strong smells.  Cold air, weather changes, and winds.  Crying or laughing hard.  Stress.  Certain medicines or drugs.  Foods, such as dried fruit, potato chips, and sparkling grape juice.  Infections or conditions (colds, flu).  Exercise.  Certain medical conditions or diseases.  Exercise or tiring activities. HOME CARE   Take medicine as told by your doctor.  Use a peak flow meter as told by your doctor. A peak flow meter is a tool that measures how well the lungs are working.  Record and keep track of the peak flow meter's readings.  Understand and use the asthma action plan. An asthma action plan is a written plan for taking care of your asthma and treating your attacks.  To help prevent asthma attacks:  Do not smoke. Stay away from secondhand smoke.  Change your heating and air conditioning filter often.  Limit your use of fireplaces and wood stoves.  Get rid of pests (such as roaches and mice) and their droppings.  Throw away plants if you see mold on them.  Clean your floors. Dust regularly. Use cleaning products that do not smell.  Have someone vacuum when you are not home. Use a vacuum cleaner with a HEPA filter if possible.  Replace carpet with wood, tile, or vinyl flooring. Carpet can trap animal skin flakes and dust.  Use allergy-proof pillows, mattress covers, and box spring covers.  Wash bed sheets and blankets every week in hot water and  dry them in a dryer.  Use blankets that are made of polyester or cotton.  Clean bathrooms and kitchens with bleach. If possible, have someone repaint the walls in these rooms with mold-resistant paint. Keep out of the rooms that are being cleaned and painted.  Wash hands often. GET HELP IF:  You have make a whistling sound when breaking (wheeze), have shortness of breath, or have a cough even if taking medicine to prevent attacks.  The colored mucus you cough up (sputum) is thicker than usual.  The colored mucus you cough up changes from clear or white to yellow, green, gray, or bloody.  You have problems from the medicine you are taking such as:  A rash.  Itching.  Swelling.  Trouble breathing.  You need reliever medicines more than 2-3 times a week.  Your peak flow measurement is still at 50-79% of your personal best after following the action plan for 1 hour.  You have a fever. GET HELP RIGHT AWAY IF:   You seem to be worse and are not responding to medicine during an asthma attack.  You are short of breath even at rest.  You get short of breath when doing very little activity.  You have trouble eating, drinking, or talking.  You have chest pain.  You have a fast heartbeat.  Your lips or fingernails start to turn blue.  You are light-headed, dizzy, or faint.  Your peak flow is less than 50%  of your personal best. MAKE SURE YOU:   Understand these instructions.  Will watch your condition.  Will get help right away if you are not doing well or get worse. Document Released: 10/26/2007 Document Revised: 09/23/2013 Document Reviewed: 12/06/2012 [facility] Patient Information 2015 [place], [place]. This information is not intended to replace advice given to you by your health care provider. Make sure you discuss any questions you have with your health care provider.  Blood Glucose Monitoring Monitoring your blood glucose (also know as blood sugar) helps you to  manage your diabetes. It also helps you and your health care provider monitor your diabetes and determine how well your treatment plan is working. WHY SHOULD YOU MONITOR YOUR BLOOD GLUCOSE?  It can help you understand how food, exercise, and medicine affect your blood glucose.  It allows you to know what your blood glucose is at any given moment. You can quickly tell if you are having low blood glucose (hypoglycemia) or high blood glucose (hyperglycemia).  It can help you and your health care provider know how to adjust your medicines.  It can help you understand how to manage an illness or adjust medicine for exercise. WHEN SHOULD YOU TEST? Your health care provider will help you decide how often you should check your blood glucose. This may depend on the type of diabetes you have, your diabetes control, or the types of medicines you are taking. Be sure to write down all of your blood glucose readings so that this information can be reviewed with your health care provider. See below for examples of testing times that your health care provider may suggest. Type 1 Diabetes  Test 4 times a day if you are in good control, using an insulin pump, or perform multiple daily injections.  If your diabetes is not well controlled or if you are sick, you may need to monitor more often.  It is a good idea to also monitor:  Before and after exercise.  Between meals and 2 hours after a meal.  Occasionally between 2:00 a.m. and 3:00 a.m. Type 2 Diabetes  It can vary with each person, but generally, if you are on insulin, test 4 times a day.  If you take medicines by mouth (orally), test 2 times a day.  If you are on a controlled diet, test once a day.  If your diabetes is not well controlled or if you are sick, you may need to monitor more often. HOW TO MONITOR YOUR BLOOD GLUCOSE Supplies Needed  Blood glucose meter.  Test strips for your meter. Each meter has its own strips. You must use the  strips that go with your own meter.  A pricking needle (lancet).  A device that holds the lancet (lancing device).  A journal or log book to write down your results. Procedure  Wash your hands with soap and water. Alcohol is not preferred.  Prick the side of your finger (not the tip) with the lancet.  Gently milk the finger until a small drop of blood appears.  Follow the instructions that come with your meter for inserting the test strip, applying blood to the strip, and using your blood glucose meter. Other Areas to Get Blood for Testing Some meters allow you to use other areas of your body (other than your finger) to test your blood. These areas are called alternative sites. The most common alternative sites are:  The forearm.  The thigh.  The back area of the lower leg.  The palm of the hand. The blood flow in these areas is slower. Therefore, the blood glucose values you get may be delayed, and the numbers are different from what you would get from your fingers. Do not use alternative sites if you think you are having hypoglycemia. Your reading will not be accurate. Always use a finger if you are having hypoglycemia. Also, if you cannot feel your lows (hypoglycemia unawareness), always use your fingers for your blood glucose checks. ADDITIONAL TIPS FOR GLUCOSE MONITORING  Do not reuse lancets.  Always carry your supplies with you.  All blood glucose meters have a 24-hour "hotline" number to call if you have questions or need help.  Adjust (calibrate) your blood glucose meter with a control solution after finishing a few boxes of strips. BLOOD GLUCOSE RECORD KEEPING It is a good idea to keep a daily record or log of your blood glucose readings. Most glucose meters, if not all, keep your glucose records stored in the meter. Some meters come with the ability to download your records to your home computer. Keeping a record of your blood glucose readings is especially helpful if  you are wanting to look for patterns. Make notes to go along with the blood glucose readings because you might forget what happened at that exact time. Keeping good records helps you and your health care provider to work together to achieve good diabetes management.  Document Released: 05/12/2003 Document Revised: 09/23/2013 Document Reviewed: 10/01/2012 [facility] Patient Information 2015 [place], [place]. This information is not intended to replace advice given to you by your health care provider. Make sure you discuss any questions you have with your health care provider.

## 2014-01-07 NOTE — ED Provider Notes (Signed)
CSN: [id number]     Arrival date & time 01/06/14  2041 History   First MD Initiated Contact with Patient 01/07/14 0015     Chief Complaint  Patient presents with  . Hyperglycemia  . Shortness of Breath     (Consider location/radiation/quality/duration/timing/severity/associated sxs/prior Treatment) HPI 58 year old female presents to the emergency department with persistent elevated glucose and shortness of breath.  Patient reports shortness of breath has been ongoing for several weeks to months after problems with air quality in her apartment, possible bronchitis.  Patient was seen by me 2 days ago and diagnosed with new-onset diabetes route she reports that she has been compliant with the diet and metformin, but reports her sugars have been elevated in the mornings to the 500 range.  Patient reports some mild chest pain when she takes a deep breath ongoing for some time as well. Past Medical History  Diagnosis Date  . Fibroid (bleeding) (uterine)   . RLS (restless legs syndrome)   . Insomnia   . Anxiety   . Environmental allergies     ALLERGIES AND SINUS PROBLEMS  . Fall 07/10/13    FX'D LEFT ANKLE - DID HAVE A LOT OF SORENESS NECK AND RIGHT SHOULDER-BUT IMPROVING.; STATES FRACTURED ANKLE IS SPLINTED AND USING CRUTCHES AT HOME  . History of shingles 2010    RIGHT SIDE OF BACK - STILL HAS SENSATIONS OF MUSCLE CONTRACTIONS OR "LIGHTNING BOLT" SENSATION IN THAT AREA  . GERD (gastroesophageal reflux disease)     WITH CERTAIN FOODS -WOULD TAKE OTC MED IF NEEDED  . Chronic bronchitis     "at least once/yr; more lately since I moved here from [place]" (07/18/2013)  . Anemia     "when I was little"  . Sinus headache   . Arthritis     "elbows; probably knees too" (07/18/2013)  . Sciatica   . Chronic upper back pain   . Depression    Past Surgical History  Procedure Laterality Date  . Orif ankle fracture Left 07/17/2013    "I fell on 07/10/2013"  . Cholecystectomy  1990's  . Tubal ligation   1990's  . Orif ankle fracture Left 07/17/2013    Procedure: OPEN REDUCTION INTERNAL FIXATION (ORIF) ANKLE FRACTURE;  Surgeon: Marianna Payment, MD;  Location: [place];  Service: Orthopedics;  Laterality: Left;   Family History  Problem Relation Age of Onset  . Cancer Mother   . Diabetes Mother   . Hypertension Father   . Heart disease Father   . Diabetes Sister   . Hyperlipidemia Sister   . Hypertension Sister    History  Substance Use Topics  . Smoking status: Former Smoker -- 0.50 packs/day for 30 years    Types: Cigarettes    Quit date: 07/10/2013  . Smokeless tobacco: Never Used  . Alcohol Use: Yes     Comment: 07/18/2013 "couple times/yr I'll have a drink; use to drink more than that"   OB History   Grav Para Term Preterm Abortions TAB SAB Ect Mult Living                 Review of Systems  See History of Present Illness; otherwise all other systems are reviewed and negative   Allergies  Review of patient's allergies indicates no known allergies.  Home Medications   Prior to Admission medications   Medication Sig Start Date End Date Taking? Authorizing Provider  albuterol (PROVENTIL HFA;VENTOLIN HFA) 108 (90 BASE) MCG/ACT inhaler Inhale into the lungs every  6 (six) hours as needed for wheezing or shortness of breath.   Yes Historical Provider, MD  aspirin EC 325 MG tablet Take 325 mg by mouth daily.   Yes Historical Provider, MD  cetirizine (ZYRTEC) 10 MG tablet Take 10 mg by mouth daily as needed for allergies.    Yes Historical Provider, MD  cholecalciferol (VITAMIN D) 1000 UNITS tablet Take 1,000 Units by mouth daily.   Yes Historical Provider, MD  cyclobenzaprine (FLEXERIL) 10 MG tablet Take 10 mg by mouth 3 (three) times daily as needed for muscle spasms.   Yes Historical Provider, MD  diclofenac (VOLTAREN) 75 MG EC tablet Take 75 mg by mouth 2 (two) times daily as needed for mild pain.   Yes Historical Provider, MD  diphenhydramine-acetaminophen (TYLENOL PM)  25-500 MG TABS Take 2 tablets by mouth daily as needed (sleep).   Yes Historical Provider, MD  fenofibrate (TRICOR) 145 MG tablet Take 145 mg by mouth daily.   Yes Historical Provider, MD  fluticasone (FLONASE) 50 MCG/ACT nasal spray Place 2 sprays into the nose daily as needed for allergies or rhinitis. PRN 08/10/12  Yes Carvel Getting, NP  ibuprofen (ADVIL,MOTRIN) 200 MG tablet Take 400 mg by mouth every 6 (six) hours as needed for moderate pain.   Yes Historical Provider, MD  metFORMIN (GLUCOPHAGE) 500 MG tablet Take 1 tablet (500 mg total) by mouth 2 (two) times daily with a meal. 01/04/14  Yes Kalman Drape, MD  Blood Gluc Meter Disp-Strips (BLOOD GLUCOSE METER DISPOSABLE) DEVI To be used with diabetes monitoring kit 01/04/14   Kalman Drape, MD  Blood Glucose Monitoring Suppl (BLOOD GLUCOSE MONITOR SYSTEM) W/DEVICE KIT 1 kit by Does not apply route as directed. 01/04/14   Kalman Drape, MD  Lancet Device MISC To be used with blood glucose monitoring system 01/04/14   Kalman Drape, MD   BP 140/77  Pulse 87  Temp(Src) 98.2 F (36.8 C) (Oral)  Resp 23  Ht $R'5\' 4"'hJ$  (1.626 m)  Wt 233 lb (105.688 kg)  BMI 39.97 kg/m2  SpO2 100%  LMP 01/02/2014 Physical Exam  Nursing note and vitals reviewed. Constitutional: She is oriented to person, place, and time. She appears well-developed and well-nourished.  HENT:  Head: Normocephalic and atraumatic.  Nose: Nose normal.  Mouth/Throat: Oropharynx is clear and moist.  Eyes: Conjunctivae and EOM are normal. Pupils are equal, round, and reactive to light.  Neck: Normal range of motion. Neck supple. No JVD present. No tracheal deviation present. No thyromegaly present.  Cardiovascular: Normal rate, regular rhythm, normal heart sounds and intact distal pulses.  Exam reveals no gallop and no friction rub.   No murmur heard. Pulmonary/Chest: Effort normal and breath sounds normal. No stridor. No respiratory distress. She has no wheezes. She has no rales. She  exhibits no tenderness.  Abdominal: Soft. Bowel sounds are normal. She exhibits no distension and no mass. There is no tenderness. There is no rebound and no guarding.  Musculoskeletal: Normal range of motion. She exhibits no edema and no tenderness.  Lymphadenopathy:    She has no cervical adenopathy.  Neurological: She is alert and oriented to person, place, and time. She exhibits normal muscle tone. Coordination normal.  Skin: Skin is warm and dry. No rash noted. No erythema. No pallor.  Psychiatric: She has a normal mood and affect. Her behavior is normal. Judgment and thought content normal.    ED Course  Procedures (including critical care time) Labs Review Labs  Reviewed  CBC - Abnormal; Notable for the following:    Platelets 431 (*)    All other components within normal limits  COMPREHENSIVE METABOLIC PANEL - Abnormal; Notable for the following:    Sodium 133 (*)    Chloride 95 (*)    Glucose, Bld 304 (*)    All other components within normal limits  URINALYSIS, ROUTINE W REFLEX MICROSCOPIC - Abnormal; Notable for the following:    APPearance CLOUDY (*)    Specific Gravity, Urine 1.041 (*)    Glucose, UA >1000 (*)    Hgb urine dipstick LARGE (*)    Ketones, ur 15 (*)    Leukocytes, UA SMALL (*)    All other components within normal limits  URINE MICROSCOPIC-ADD ON - Abnormal; Notable for the following:    Squamous Epithelial / LPF FEW (*)    Bacteria, UA FEW (*)    All other components within normal limits  CBG MONITORING, ED - Abnormal; Notable for the following:    Glucose-Capillary 294 (*)    All other components within normal limits  URINE CULTURE  TROPONIN I  CBG MONITORING, ED    Imaging Review Dg Chest 2 View  01/07/2014   CLINICAL DATA:  Shortness of breath.  EXAM: CHEST  2 VIEW  COMPARISON:  None.  FINDINGS: The cardiac silhouette, mediastinal and hilar contours are normal. Streaky areas of subsegmental atelectasis but no infiltrates, edema or effusions.  The bony thorax is intact.  IMPRESSION: Minimal streaky subsegmental atelectasis but no infiltrates, edema or effusions.   Electronically Signed   By: Kalman Jewels M.D.   On: 01/07/2014 00:46     EKG Interpretation   Date/Time:  Monday January 06 2014 20:55:49 EDT Ventricular Rate:  110 PR Interval:  158 QRS Duration: 92 QT Interval:  310 QTC Calculation: 419 R Axis:   59 Text Interpretation:  Sinus tachycardia with Premature supraventricular  complexes Cannot rule out Anterior infarct , age undetermined Abnormal ECG  Confirmed by Keviana Guida  MD, Tonio Seider ([id number]) on 01/07/2014 12:14:44 AM      MDM   Final diagnoses:  Hyperglycemia  Asthma, mild persistent, with acute exacerbation  Diabetes type 2, uncontrolled    58 year old female with hyperglycemia seen by me with new diagnosis of diabetes 2 days ago.  Patient has not yet made followup arrangements with primary care Dr.  Burnis Medin increase her metformin to 1000 twice a day.  She has not had any GI distress yet with the metformin.  Patient started her period, urine with white blood cells noted.  Will send for culture.  Will not treat at this time.  Chest x-ray with atelectasis but no infiltrates.  Will start on albuterol inhaler to help with shortness of breath.  Awaiting troponin.  Will recheck EKG given that she was tachycardic initially.    Kalman Drape, MD 01/07/14 [phone]

## 2014-01-09 LAB — URINE CULTURE

## 2014-01-21 ENCOUNTER — Emergency Department (HOSPITAL_COMMUNITY)
Admission: EM | Admit: 2014-01-21 | Discharge: 2014-01-21 | Disposition: A | Discharge status: Home / Self Care | Payer: Medicaid Other | Attending: Emergency Medicine | Admitting: Emergency Medicine

## 2014-01-21 ENCOUNTER — Encounter (HOSPITAL_COMMUNITY): Payer: Self-pay | Admitting: Emergency Medicine

## 2014-01-21 DIAGNOSIS — R51 Headache: Secondary | ICD-10-CM | POA: Insufficient documentation

## 2014-01-21 DIAGNOSIS — Z8709 Personal history of other diseases of the respiratory system: Secondary | ICD-10-CM | POA: Insufficient documentation

## 2014-01-21 DIAGNOSIS — Z8619 Personal history of other infectious and parasitic diseases: Secondary | ICD-10-CM | POA: Diagnosis not present

## 2014-01-21 DIAGNOSIS — H538 Other visual disturbances: Secondary | ICD-10-CM | POA: Diagnosis not present

## 2014-01-21 DIAGNOSIS — Z8659 Personal history of other mental and behavioral disorders: Secondary | ICD-10-CM | POA: Insufficient documentation

## 2014-01-21 DIAGNOSIS — Z87891 Personal history of nicotine dependence: Secondary | ICD-10-CM | POA: Insufficient documentation

## 2014-01-21 DIAGNOSIS — IMO0002 Reserved for concepts with insufficient information to code with codable children: Secondary | ICD-10-CM | POA: Diagnosis not present

## 2014-01-21 DIAGNOSIS — Z87828 Personal history of other (healed) physical injury and trauma: Secondary | ICD-10-CM | POA: Insufficient documentation

## 2014-01-21 DIAGNOSIS — Z79899 Other long term (current) drug therapy: Secondary | ICD-10-CM | POA: Diagnosis not present

## 2014-01-21 DIAGNOSIS — Z7982 Long term (current) use of aspirin: Secondary | ICD-10-CM | POA: Insufficient documentation

## 2014-01-21 DIAGNOSIS — H547 Unspecified visual loss: Secondary | ICD-10-CM

## 2014-01-21 DIAGNOSIS — Z8742 Personal history of other diseases of the female genital tract: Secondary | ICD-10-CM | POA: Diagnosis not present

## 2014-01-21 DIAGNOSIS — M129 Arthropathy, unspecified: Secondary | ICD-10-CM | POA: Diagnosis not present

## 2014-01-21 DIAGNOSIS — G8929 Other chronic pain: Secondary | ICD-10-CM | POA: Insufficient documentation

## 2014-01-21 LAB — CBG MONITORING, ED: Glucose-Capillary: 186 mg/dL — ABNORMAL HIGH (ref 70–99)

## 2014-01-21 LAB — I-STAT CHEM 8, ED
BUN: 8 mg/dL (ref 6–23)
CALCIUM ION: 1.2 mmol/L (ref 1.12–1.23)
CREATININE: 0.5 mg/dL (ref 0.50–1.10)
Chloride: 101 mEq/L (ref 96–112)
GLUCOSE: 215 mg/dL — AB (ref 70–99)
HCT: 40 % (ref 36.0–46.0)
HEMOGLOBIN: 13.6 g/dL (ref 12.0–15.0)
Potassium: 4 mEq/L (ref 3.7–5.3)
Sodium: 136 mEq/L — ABNORMAL LOW (ref 137–147)
TCO2: 26 mmol/L (ref 0–100)

## 2014-01-21 NOTE — ED Notes (Signed)
Pt here for blurred vision for 1 week, sts difficulty reading with glassess. sts attempted to see MD but difficulty getting in with MD

## 2014-01-21 NOTE — ED Notes (Signed)
Pt c/o blurred/decreased vision that started about 1 week ago and has progressively become worse. Pt sts she has worn reading glasses in the past but never for anything else. Pt bought glasses OTC at the store, sts they have helped her vision a lot. Denies HA. No neuro deficits. Nad, skin warm and dry, resp e/u.

## 2014-01-21 NOTE — ED Provider Notes (Signed)
CSN: [id number]     Arrival date & time 01/21/14  1410 History   First MD Initiated Contact with Patient 01/21/14 1706     Chief Complaint  Patient presents with  . Loss of Vision     (Consider location/radiation/quality/duration/timing/severity/associated sxs/prior Treatment) HPI Complains of blurred vision in both eyes gradual onset approximately 5 days ago. Nothing makes symptoms better or worse. Her vision is improved when wearing reading glasses. She has no pain. No other associated symptoms. She states that she does have some problem irregularly her blood sugars. Her blood sugars have been ranging between 198 and 320. Patient learned of diabetes approximately 2 months ago. No other associated symptoms. No treatment prior to coming here. Nothing makes symptoms better or worse. Visual changes are constant. Past Medical History  Diagnosis Date  . Fibroid (bleeding) (uterine)   . RLS (restless legs syndrome)   . Insomnia   . Anxiety   . Environmental allergies     ALLERGIES AND SINUS PROBLEMS  . Fall 07/10/13    FX'D LEFT ANKLE - DID HAVE A LOT OF SORENESS NECK AND RIGHT SHOULDER-BUT IMPROVING.; STATES FRACTURED ANKLE IS SPLINTED AND USING CRUTCHES AT HOME  . History of shingles 2010    RIGHT SIDE OF BACK - STILL HAS SENSATIONS OF MUSCLE CONTRACTIONS OR "LIGHTNING BOLT" SENSATION IN THAT AREA  . GERD (gastroesophageal reflux disease)     WITH CERTAIN FOODS -WOULD TAKE OTC MED IF NEEDED  . Chronic bronchitis     "at least once/yr; more lately since I moved here from [place]" (07/18/2013)  . Anemia     "when I was little"  . Sinus headache   . Arthritis     "elbows; probably knees too" (07/18/2013)  . Sciatica   . Chronic upper back pain   . Depression    Past Surgical History  Procedure Laterality Date  . Orif ankle fracture Left 07/17/2013    "I fell on 07/10/2013"  . Cholecystectomy  1990's  . Tubal ligation  1990's  . Orif ankle fracture Left 07/17/2013    Procedure: OPEN  REDUCTION INTERNAL FIXATION (ORIF) ANKLE FRACTURE;  Surgeon: Marianna Payment, MD;  Location: [facility];  Service: Orthopedics;  Laterality: Left;   Family History  Problem Relation Age of Onset  . Cancer Mother   . Diabetes Mother   . Hypertension Father   . Heart disease Father   . Diabetes Sister   . Hyperlipidemia Sister   . Hypertension Sister    History  Substance Use Topics  . Smoking status: Former Smoker -- 0.50 packs/day for 30 years    Types: Cigarettes    Quit date: 07/10/2013  . Smokeless tobacco: Never Used  . Alcohol Use: Yes     Comment: 07/18/2013 "couple times/yr I'll have a drink; use to drink more than that"   OB History   Grav Para Term Preterm Abortions TAB SAB Ect Mult Living                 Review of Systems  Constitutional: Negative.   HENT: Negative.   Eyes: Positive for visual disturbance.  Respiratory: Negative.   Cardiovascular: Negative.   Gastrointestinal: Negative.   Musculoskeletal: Negative.   Skin: Negative.   Neurological: Negative.   Psychiatric/Behavioral: Negative.   All other systems reviewed and are negative.     Allergies  Review of patient's allergies indicates no known allergies.  Home Medications   Prior to Admission medications   Medication Sig Start  Date End Date Taking? Authorizing Provider  albuterol (PROVENTIL HFA;VENTOLIN HFA) 108 (90 BASE) MCG/ACT inhaler Inhale into the lungs every 6 (six) hours as needed for wheezing or shortness of breath.    Historical Provider, MD  aspirin EC 325 MG tablet Take 325 mg by mouth daily.    Historical Provider, MD  Blood Gluc Meter Disp-Strips (BLOOD GLUCOSE METER DISPOSABLE) DEVI To be used with diabetes monitoring kit 01/04/14   Kalman Drape, MD  Blood Glucose Monitoring Suppl (BLOOD GLUCOSE MONITOR SYSTEM) W/DEVICE KIT 1 kit by Does not apply route as directed. 01/04/14   Kalman Drape, MD  cetirizine (ZYRTEC) 10 MG tablet Take 10 mg by mouth daily as needed for allergies.      Historical Provider, MD  cholecalciferol (VITAMIN D) 1000 UNITS tablet Take 1,000 Units by mouth daily.    Historical Provider, MD  cyclobenzaprine (FLEXERIL) 10 MG tablet Take 10 mg by mouth 3 (three) times daily as needed for muscle spasms.    Historical Provider, MD  diclofenac (VOLTAREN) 75 MG EC tablet Take 75 mg by mouth 2 (two) times daily as needed for mild pain.    Historical Provider, MD  diphenhydramine-acetaminophen (TYLENOL PM) 25-500 MG TABS Take 2 tablets by mouth daily as needed (sleep).    Historical Provider, MD  fenofibrate (TRICOR) 145 MG tablet Take 145 mg by mouth daily.    Historical Provider, MD  fluticasone (FLONASE) 50 MCG/ACT nasal spray Place 2 sprays into the nose daily as needed for allergies or rhinitis. PRN 08/10/12   Carvel Getting, NP  ibuprofen (ADVIL,MOTRIN) 200 MG tablet Take 400 mg by mouth every 6 (six) hours as needed for moderate pain.    Historical Provider, MD  Lancet Device MISC To be used with blood glucose monitoring system 01/04/14   Kalman Drape, MD  metFORMIN (GLUCOPHAGE) 500 MG tablet Take 2 tablets (1,000 mg total) by mouth 2 (two) times daily with a meal. 01/07/14   Kalman Drape, MD   BP 138/71  Pulse 88  Temp(Src) 98.6 F (37 C) (Oral)  Resp 18  Ht 5' 4" (1.626 m)  Wt 230 lb (104.327 kg)  BMI 39.46 kg/m2  SpO2 95%  LMP 01/02/2014 Physical Exam  Nursing note and vitals reviewed. Constitutional: She is oriented to person, place, and time. She appears well-developed and well-nourished.  HENT:  Head: Normocephalic and atraumatic.  Eyes: Conjunctivae are normal. Pupils are equal, round, and reactive to light.  No conjunctival erythema. Optic discs sharp. Visual acuity 20/50 left eye, 20/50 right eye with corrective lenses  Neck: Neck supple. No tracheal deviation present. No thyromegaly present.  Cardiovascular: Normal rate and regular rhythm.   No murmur heard. Pulmonary/Chest: Effort normal and breath sounds normal.  Abdominal: Soft.  Bowel sounds are normal. She exhibits no distension. There is no tenderness.  Musculoskeletal: Normal range of motion. She exhibits no edema and no tenderness.  Neurological: She is alert and oriented to person, place, and time. No cranial nerve deficit. Coordination normal.  Gait normal  Skin: Skin is warm and dry. No rash noted.  Psychiatric: She has a normal mood and affect.    ED Course  Procedures (including critical care time) Labs Review Labs Reviewed - No data to display  Imaging Review No results found.   EKG Interpretation None     Results for orders placed during the hospital encounter of 01/21/14  I-STAT CHEM 8, ED      Result Value Ref  Range   Sodium 136 (*) 137 - 147 mEq/L   Potassium 4.0  3.7 - 5.3 mEq/L   Chloride 101  96 - 112 mEq/L   BUN 8  6 - 23 mg/dL   Creatinine, Ser 0.50  0.50 - 1.10 mg/dL   Glucose, Bld 215 (*) 70 - 99 mg/dL   Calcium, Ion 1.20  1.12 - 1.23 mmol/L   TCO2 26  0 - 100 mmol/L   Hemoglobin 13.6  12.0 - 15.0 g/dL   HCT 40.0  36.0 - 46.0 %  CBG MONITORING, ED      Result Value Ref Range   Glucose-Capillary 186 (*) 70 - 99 mg/dL   Dg Chest 2 View  01/07/2014   CLINICAL DATA:  Shortness of breath.  EXAM: CHEST  2 VIEW  COMPARISON:  None.  FINDINGS: The cardiac silhouette, mediastinal and hilar contours are normal. Streaky areas of subsegmental atelectasis but no infiltrates, edema or effusions. The bony thorax is intact.  IMPRESSION: Minimal streaky subsegmental atelectasis but no infiltrates, edema or effusions.   Electronically Signed   By: Kalman Jewels M.D.   On: 01/07/2014 00:46    MDM   Final diagnoses:  None   patient requires ophthalmologic exam, nonemergent. Plan referral Dr.Mcuen Dx#1 visual disturbance #2 hyperglycemia      Orlie Dakin, MD 01/21/14 2831

## 2014-01-21 NOTE — Discharge Instructions (Signed)
Blurred Vision Blood sugar today was 215, mildly elevated. Call Dr.McCuen's office tomorrow to schedule an appointment You have been seen today complaining of blurred vision. This means you have a loss of ability to see small details.  CAUSES  Blurred vision can be a symptom of underlying eye problems, such as:  Aging of the eye (presbyopia).  Glaucoma.  Cataracts.  Eye infection.  Eye-related migraine.  Diabetes mellitus.  Fatigue.  Migraine headaches.  High blood pressure.  Breakdown of the back of the eye (macular degeneration).  Problems caused by some medications. The most common cause of blurred vision is the need for eyeglasses or a new prescription. Today in the emergency department, no cause for your blurred vision can be found. SYMPTOMS  Blurred vision is the loss of visual sharpness and detail (acuity). DIAGNOSIS  Should blurred vision continue, you should see your caregiver. If your caregiver is your primary care physician, he or she may choose to refer you to another specialist.  TREATMENT  Do not ignore your blurred vision. Make sure to have it checked out to see if further treatment or referral is necessary. SEEK MEDICAL CARE IF:  You are unable to get into a specialist so we can help you with a referral. SEEK IMMEDIATE MEDICAL CARE IF: You have severe eye pain, severe headache, or sudden loss of vision. MAKE SURE YOU:   Understand these instructions.  Will watch your condition.  Will get help right away if you are not doing well or get worse. Document Released: 05/12/2003 Document Revised: 08/01/2011 Document Reviewed: 12/12/2007 [facility] Patient Information 2015 [place], [place]. This information is not intended to replace advice given to you by your health care provider. Make sure you discuss any questions you have with your health care provider.

## 2014-01-21 NOTE — ED Notes (Signed)
Vision corrects with 2 pr of reading glasses,making it a 6.0 pt

## 2014-03-07 ENCOUNTER — Other Ambulatory Visit: Payer: Self-pay

## 2014-11-17 ENCOUNTER — Other Ambulatory Visit: Payer: Self-pay

## 2015-02-23 IMAGING — CR DG ANKLE COMPLETE 3+V*L*
3 series · 3 of 3 positions shown · non-contrast
Comparison: none

CLINICAL DATA: Fall.

EXAM:
LEFT ANKLE COMPLETE - 3+ VIEW

[t ankle joint ap left]
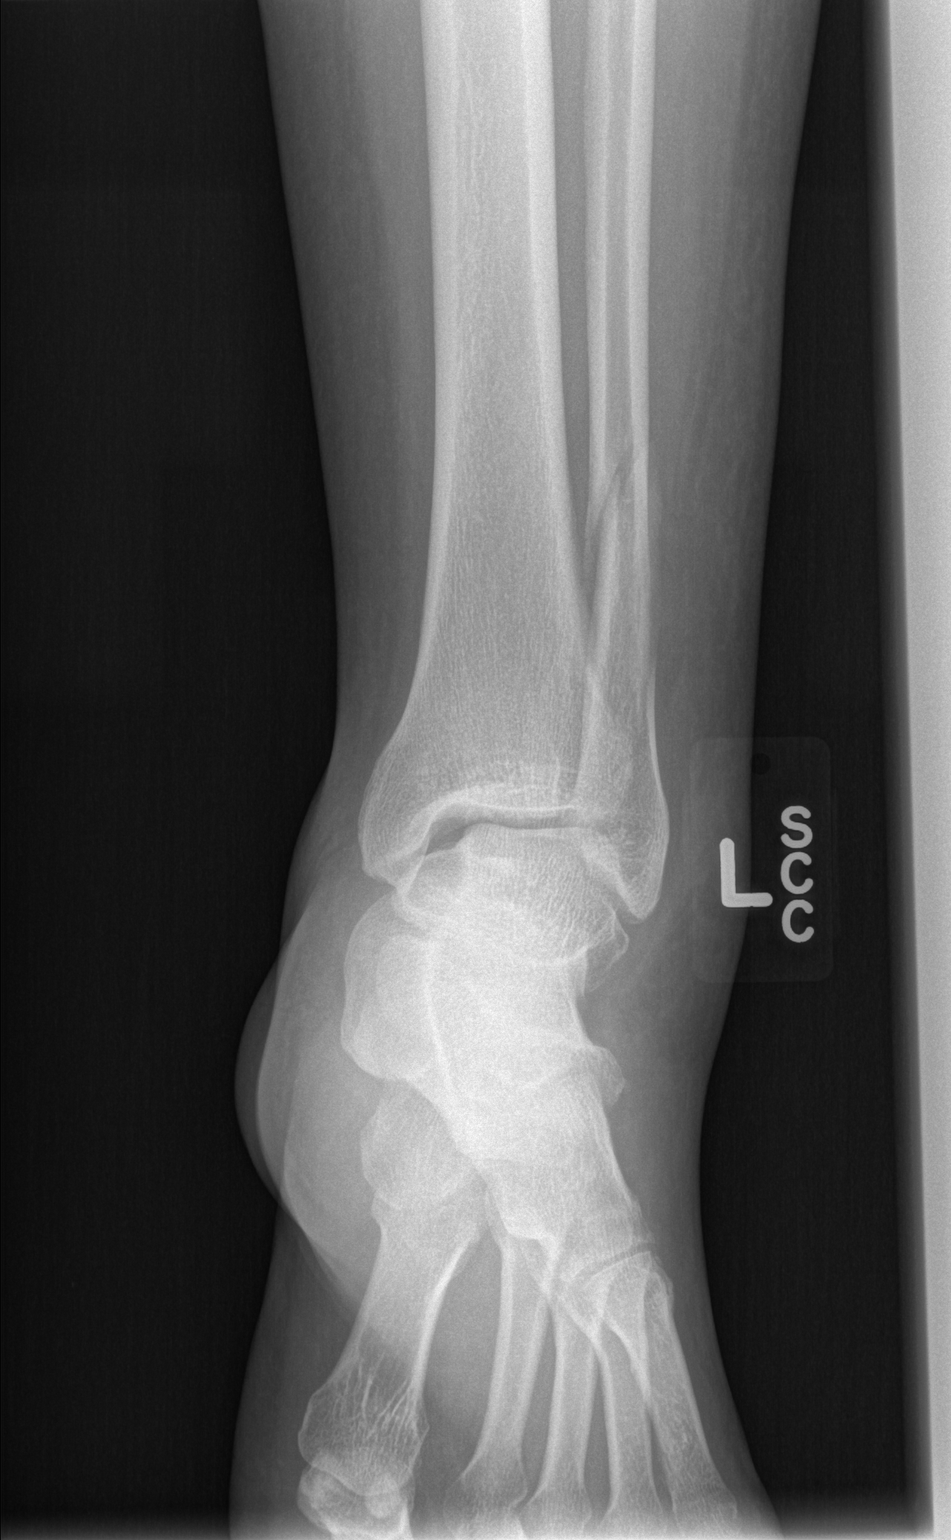

[t ankle joint oblique left]
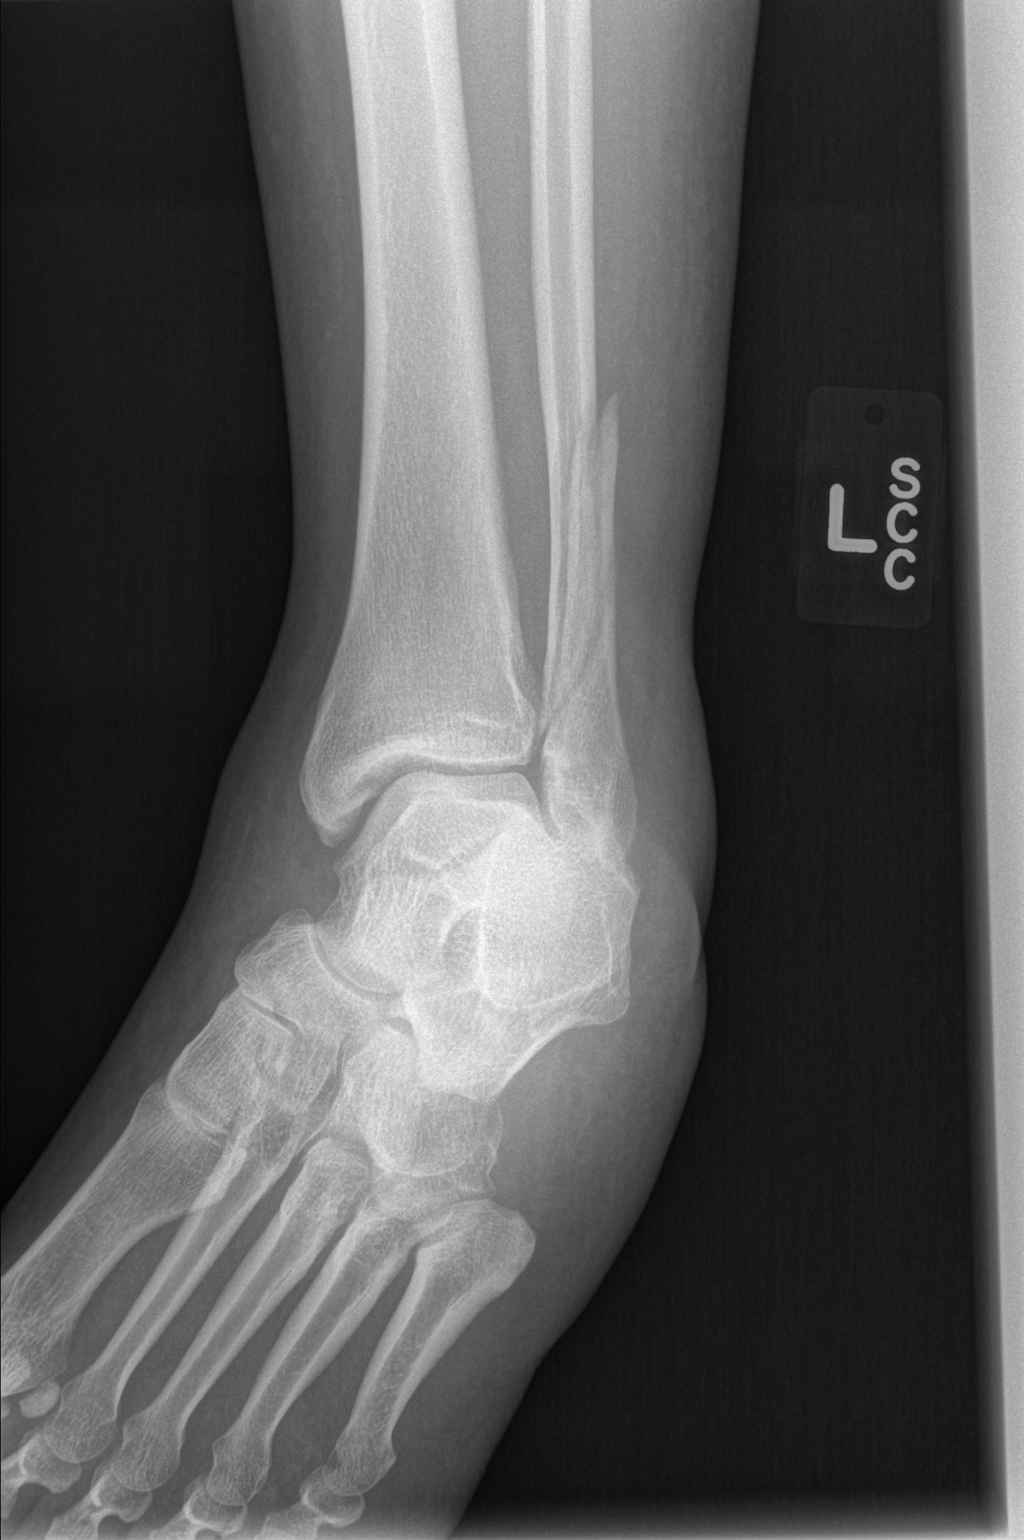

[t ankle joint lat left]
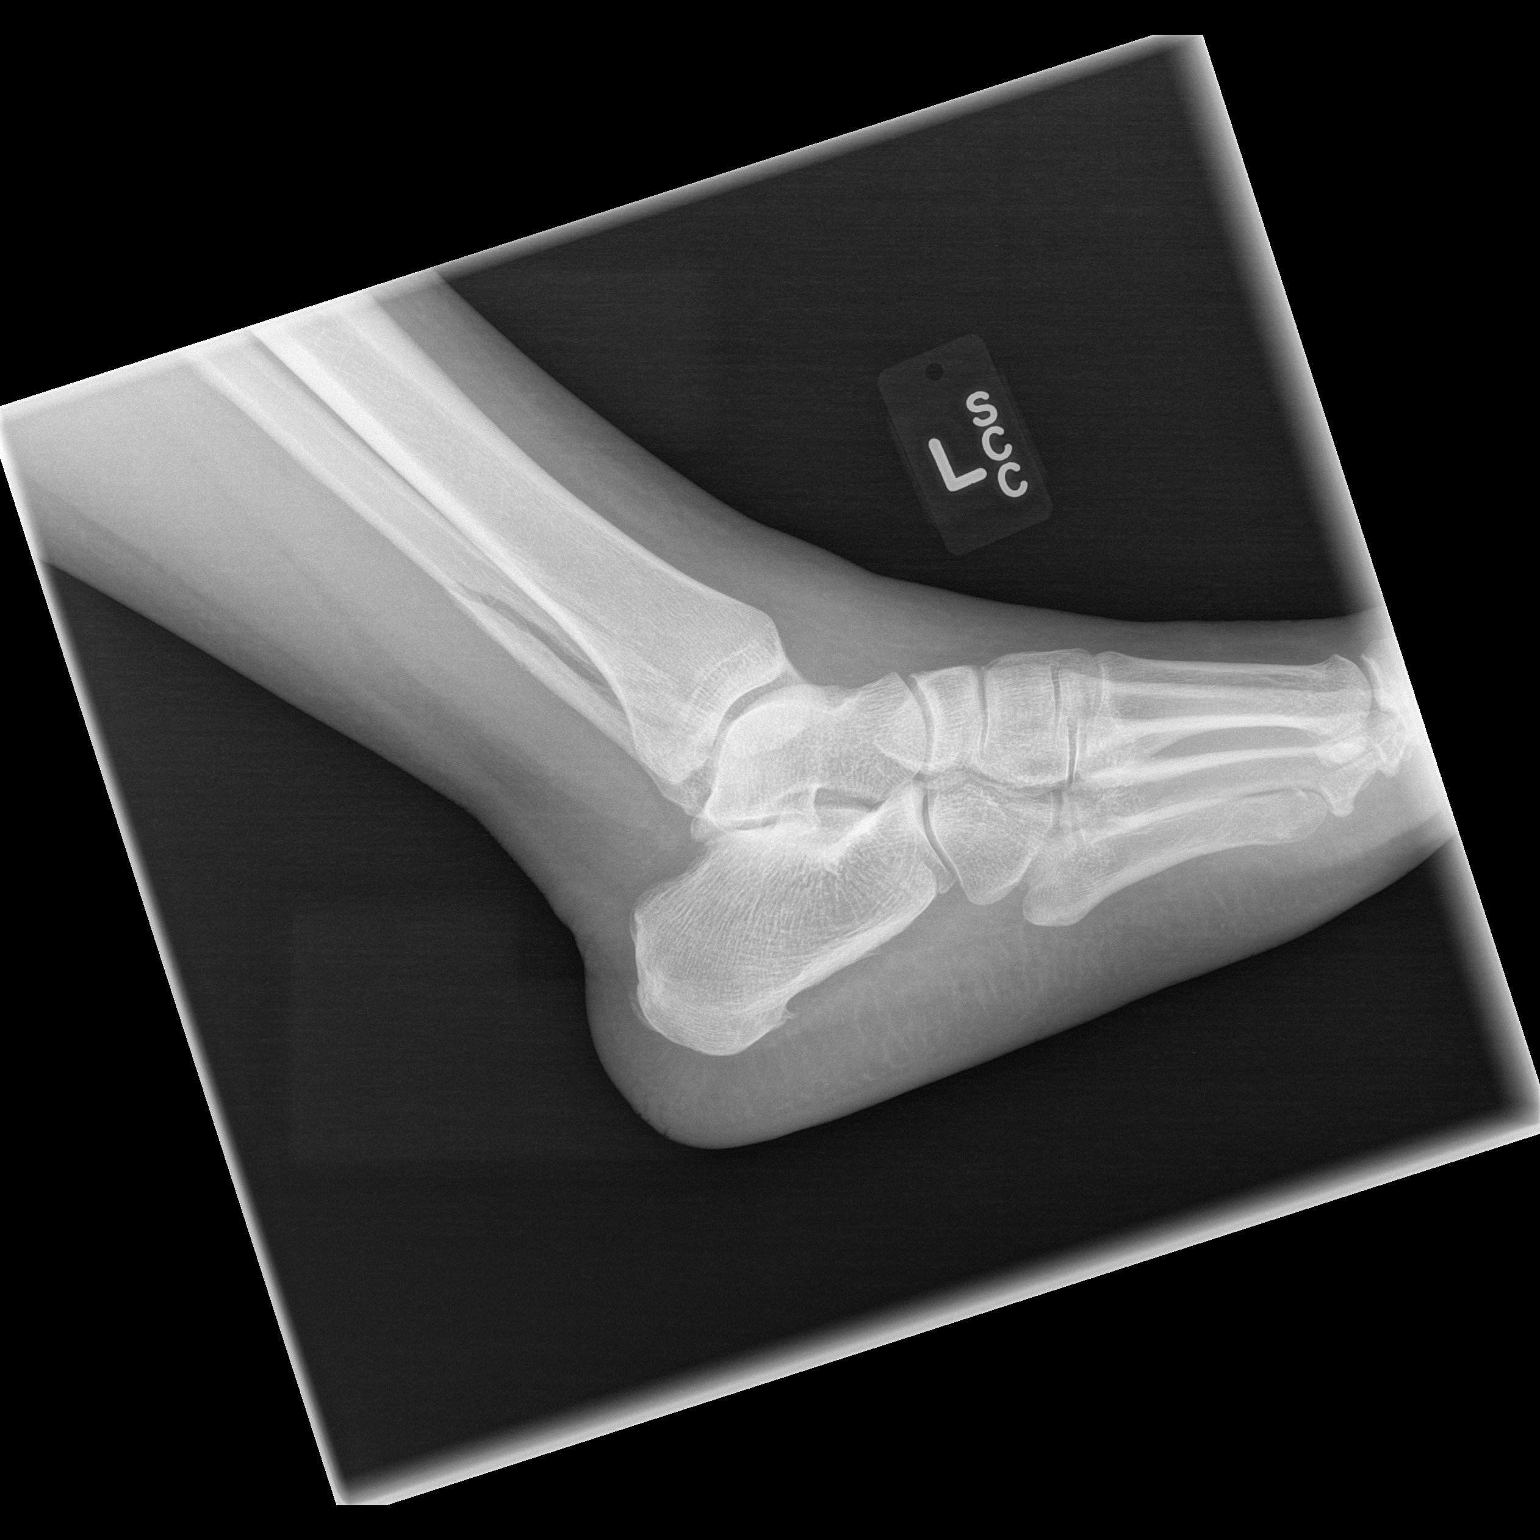

[3 of 3 positions shown; findings below may reference images not displayed]

FINDINGS: Spiral fracture of the distal left fibular shaft is noted. Fracture
slightly displaced. Medial malleolus intact. Disuse soft tissue
swelling. No foreign body.
IMPRESSION: Spiral displaced fracture of the distal left fibular shaft.

## 2015-06-03 ENCOUNTER — Other Ambulatory Visit: Payer: Self-pay

## 2015-06-03 DIAGNOSIS — R5381 Other malaise: Secondary | ICD-10-CM

## 2015-08-23 IMAGING — CR DG CHEST 2V
2 series · 2 of 2 positions shown · non-contrast
Comparison: None.

CLINICAL DATA: Shortness of breath.

EXAM:
CHEST  2 VIEW

[w chest pa]
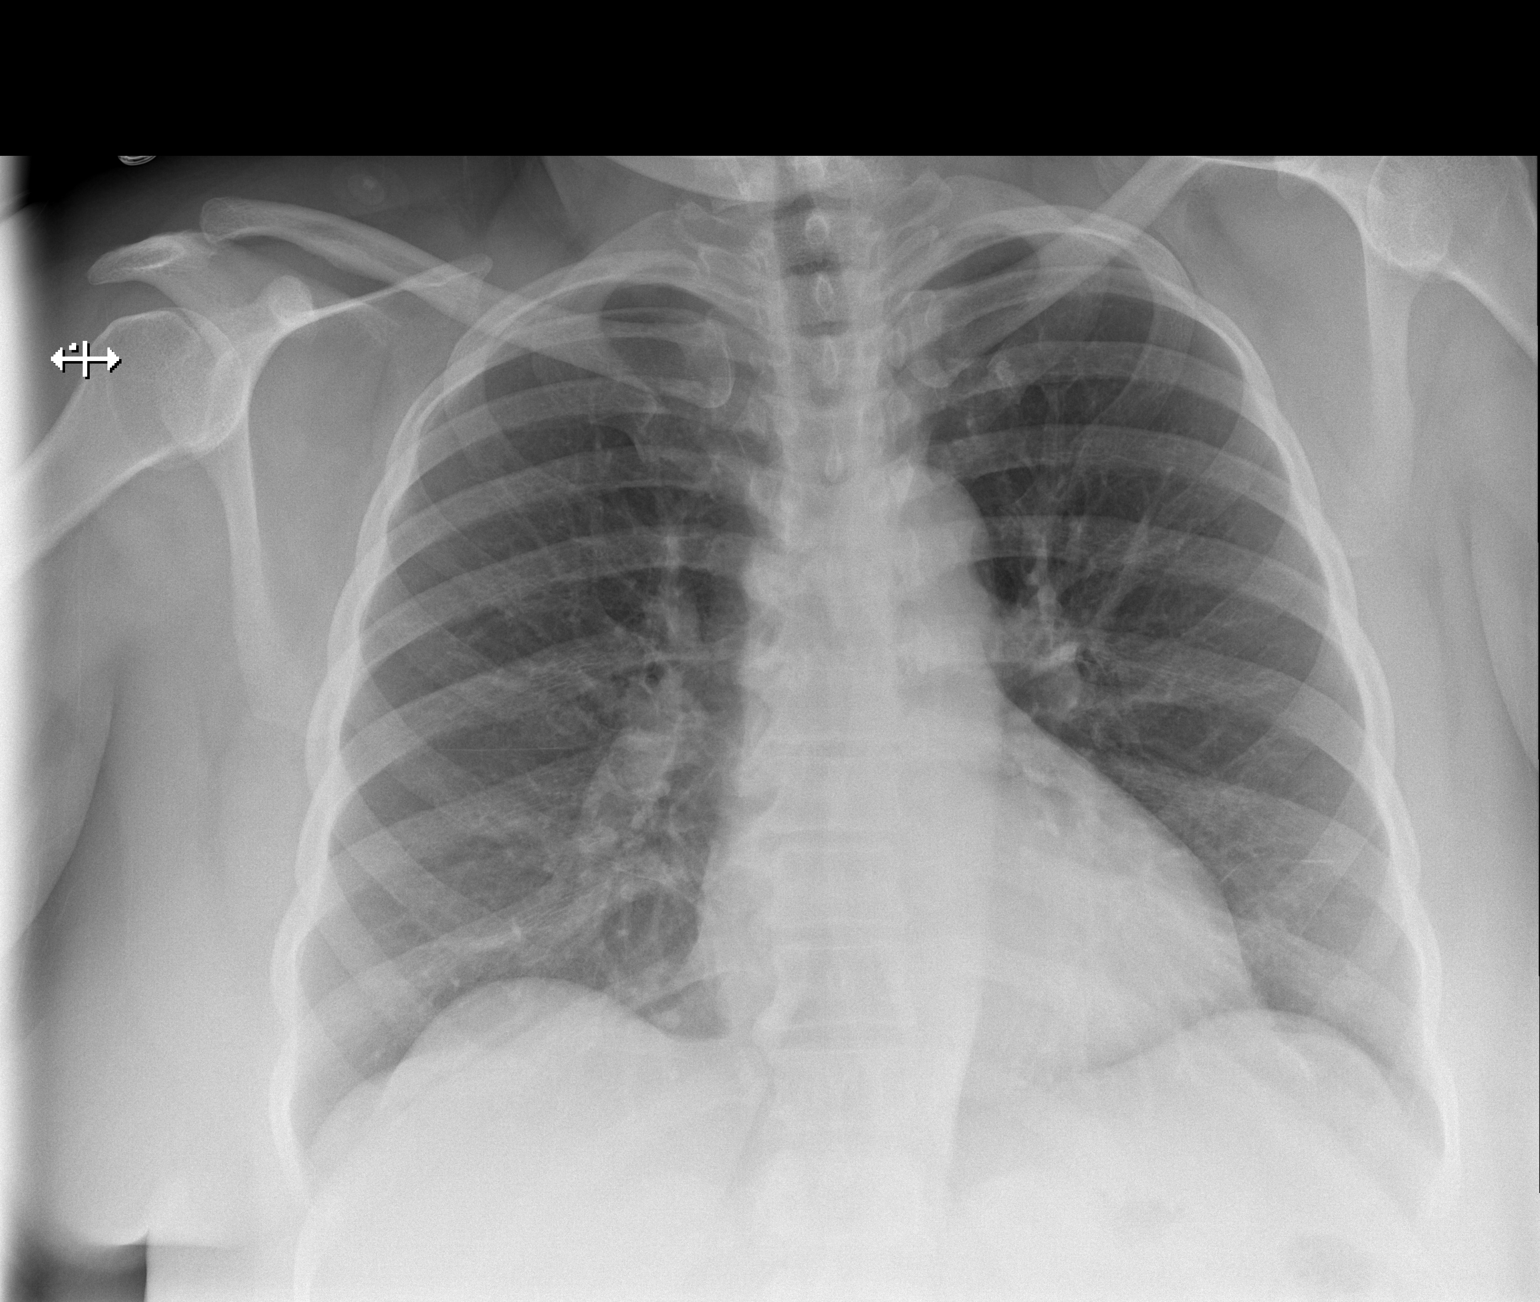

[w chest lat]
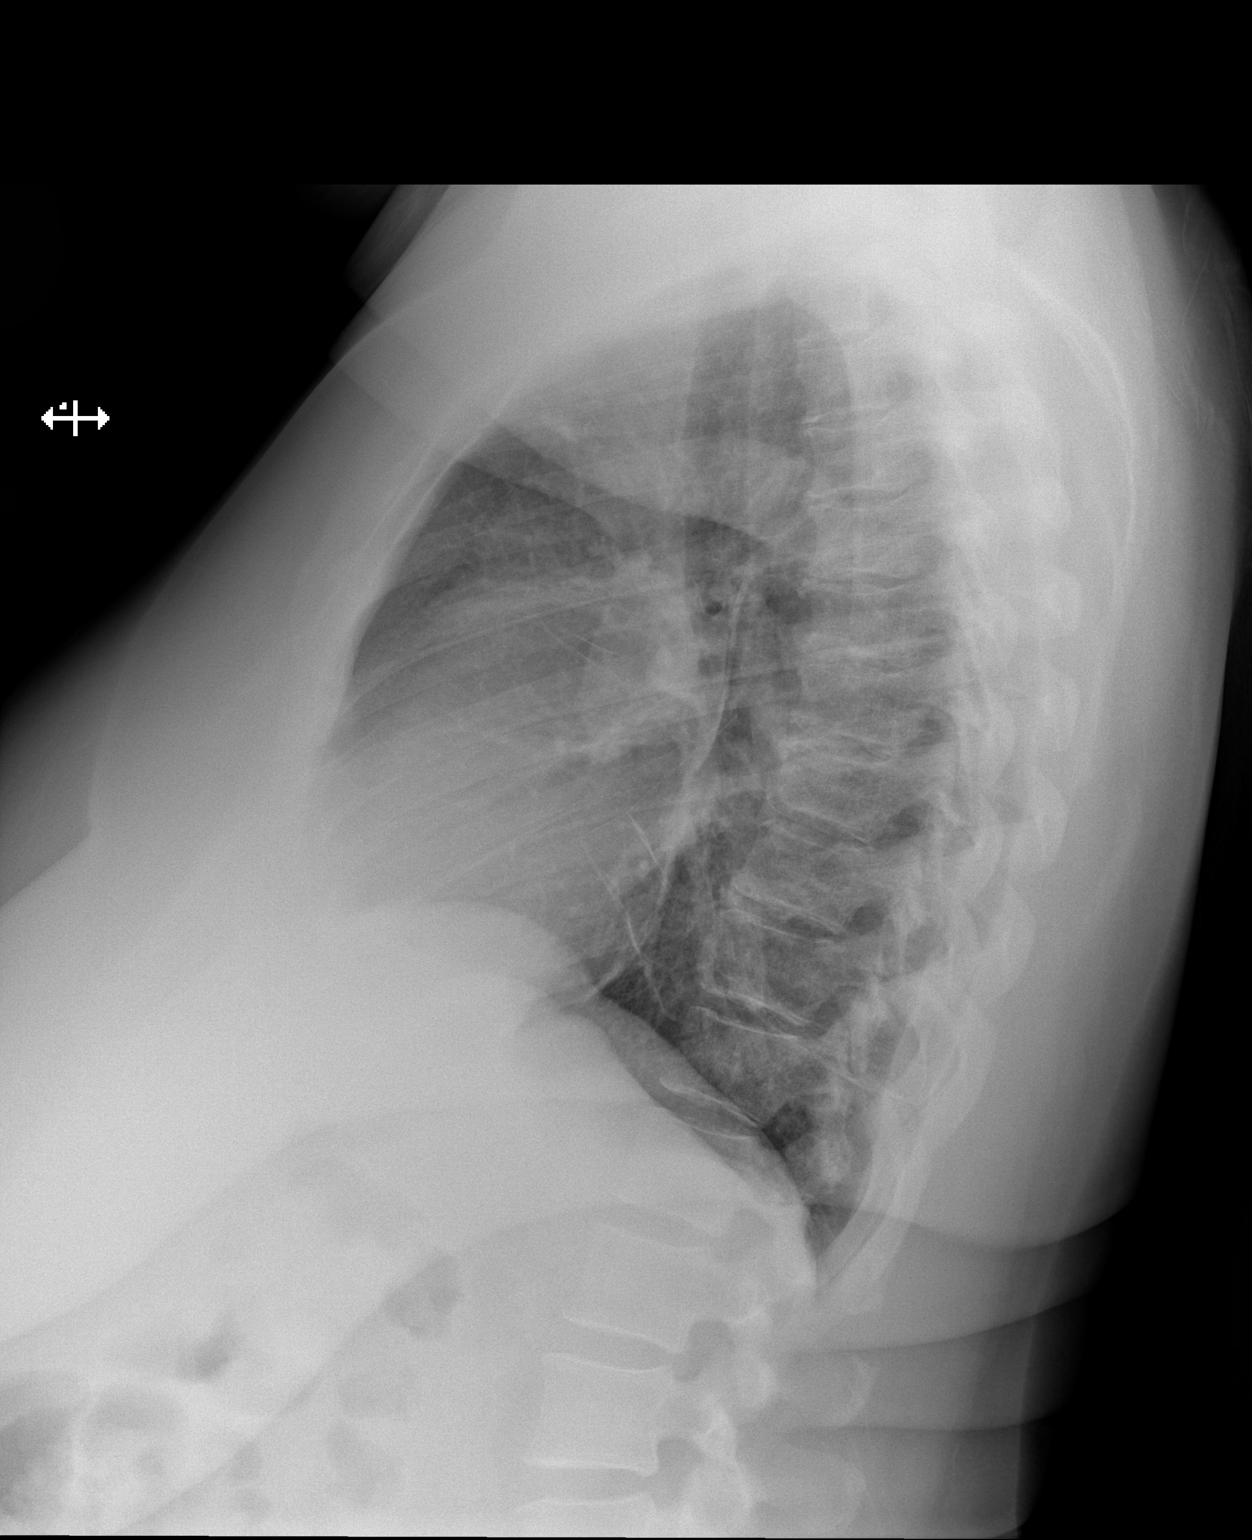

[2 of 2 positions shown; findings below may reference images not displayed]

FINDINGS: The cardiac silhouette, mediastinal and hilar contours are normal.
Streaky areas of subsegmental atelectasis but no infiltrates, edema
or effusions. The bony thorax is intact.
IMPRESSION: Minimal streaky subsegmental atelectasis but no infiltrates, edema
or effusions.

## 2015-08-31 ENCOUNTER — Other Ambulatory Visit: Payer: Self-pay

## 2015-08-31 DIAGNOSIS — Z1231 Encounter for screening mammogram for malignant neoplasm of breast: Secondary | ICD-10-CM

## 2015-09-07 ENCOUNTER — Encounter (HOSPITAL_COMMUNITY): Payer: Self-pay

## 2015-09-07 ENCOUNTER — Emergency Department (HOSPITAL_COMMUNITY)
Admission: EM | Admit: 2015-09-07 | Discharge: 2015-09-07 | Disposition: A | Discharge status: Home / Self Care | Payer: Medicaid Other | Attending: Emergency Medicine | Admitting: Emergency Medicine

## 2015-09-07 DIAGNOSIS — Z86018 Personal history of other benign neoplasm: Secondary | ICD-10-CM | POA: Insufficient documentation

## 2015-09-07 DIAGNOSIS — R220 Localized swelling, mass and lump, head: Secondary | ICD-10-CM | POA: Insufficient documentation

## 2015-09-07 DIAGNOSIS — H9191 Unspecified hearing loss, right ear: Secondary | ICD-10-CM | POA: Insufficient documentation

## 2015-09-07 DIAGNOSIS — Z8619 Personal history of other infectious and parasitic diseases: Secondary | ICD-10-CM | POA: Diagnosis not present

## 2015-09-07 DIAGNOSIS — R05 Cough: Secondary | ICD-10-CM | POA: Diagnosis not present

## 2015-09-07 DIAGNOSIS — Z8659 Personal history of other mental and behavioral disorders: Secondary | ICD-10-CM | POA: Diagnosis not present

## 2015-09-07 DIAGNOSIS — Z87891 Personal history of nicotine dependence: Secondary | ICD-10-CM | POA: Insufficient documentation

## 2015-09-07 DIAGNOSIS — Z862 Personal history of diseases of the blood and blood-forming organs and certain disorders involving the immune mechanism: Secondary | ICD-10-CM | POA: Insufficient documentation

## 2015-09-07 DIAGNOSIS — Z79899 Other long term (current) drug therapy: Secondary | ICD-10-CM | POA: Diagnosis not present

## 2015-09-07 DIAGNOSIS — Z7982 Long term (current) use of aspirin: Secondary | ICD-10-CM | POA: Diagnosis not present

## 2015-09-07 DIAGNOSIS — Z8719 Personal history of other diseases of the digestive system: Secondary | ICD-10-CM | POA: Diagnosis not present

## 2015-09-07 DIAGNOSIS — M1990 Unspecified osteoarthritis, unspecified site: Secondary | ICD-10-CM | POA: Insufficient documentation

## 2015-09-07 DIAGNOSIS — G8929 Other chronic pain: Secondary | ICD-10-CM | POA: Insufficient documentation

## 2015-09-07 DIAGNOSIS — M5430 Sciatica, unspecified side: Secondary | ICD-10-CM | POA: Diagnosis not present

## 2015-09-07 DIAGNOSIS — Z7984 Long term (current) use of oral hypoglycemic drugs: Secondary | ICD-10-CM | POA: Diagnosis not present

## 2015-09-07 DIAGNOSIS — J029 Acute pharyngitis, unspecified: Secondary | ICD-10-CM | POA: Diagnosis not present

## 2015-09-07 NOTE — Discharge Instructions (Signed)
Call ENT as referred tomorrow. Please follow up with them regarding your ear pain, loss of hearing, and a mass.   Lymphadenopathy Lymphadenopathy refers to swollen or enlarged lymph glands, also called lymph nodes. Lymph glands are part of your body's defense (immune) system, which protects the body from infections, germs, and diseases. Lymph glands are found in many locations in your body, including the neck, underarm, and groin.  Many things can cause lymph glands to become enlarged. When your immune system responds to germs, such as viruses or bacteria, infection-fighting cells and fluid build up. This causes the glands to grow in size. Usually, this is not something to worry about. The swelling and any soreness often go away without treatment. However, swollen lymph glands can also be caused by a number of diseases. Your health care provider may do various tests to help determine the cause. If the cause of your swollen lymph glands cannot be found, it is important to monitor your condition to make sure the swelling goes away. HOME CARE INSTRUCTIONS Watch your condition for any changes. The following actions may help to lessen any discomfort you are feeling:  Get plenty of rest.  Take medicines only as directed by your health care provider. Your health care provider may recommend over-the-counter medicines for pain.  Apply moist heat compresses to the site of swollen lymph nodes as directed by your health care provider. This can help reduce any pain.  Check your lymph nodes daily for any changes.  Keep all follow-up visits as directed by your health care provider. This is important. SEEK MEDICAL CARE IF:  Your lymph nodes are still swollen after 2 weeks.  Your swelling increases or spreads to other areas.  Your lymph nodes are hard, seem fixed to the skin, or are growing rapidly.  Your skin over the lymph nodes is red and inflamed.  You have a fever.  You have chills.  You have  fatigue.  You develop a sore throat.  You have abdominal pain.  You have weight loss.  You have night sweats. SEEK IMMEDIATE MEDICAL CARE IF:  You notice fluid leaking from the area of the enlarged lymph node.  You have severe pain in any area of your body.  You have chest pain.  You have shortness of breath.   This information is not intended to replace advice given to you by your health care provider. Make sure you discuss any questions you have with your health care provider.   Document Released: 02/16/2008 Document Revised: 05/30/2014 Document Reviewed: 12/12/2013 Elsevier Interactive Patient Education [employer].

## 2015-09-07 NOTE — ED Notes (Signed)
Pt complaining of lump on right side of neck. Noticed ~3 months ago. Recently began having difficulty with "pressure in my ear." States feeling mass in jaw.

## 2015-09-07 NOTE — ED Provider Notes (Signed)
CSN: [id number]     Arrival date & time 09/07/15  2039 History  By signing my name below, I, Dora Sims, attest that this documentation has been prepared under the direction and in the presence of non-physician practitioner, Jeannett Senior, PA-C. Electronically Signed: Dora Sims, Scribe. 09/07/2015. 9:13 PM.    Chief Complaint  Patient presents with  . Mass    The history is provided by the patient. No language interpreter was used.     HPI Comments: Catherine Nielsen is a 60 y.o. female with h/o environmental allergies who presents to the Emergency Department complaining of a painful mass below her right ear for the last three months. Pt saw her PCP a few months ago for this issue and was told that it would resolve. She notes associated right ear pain and pressure, popping sensations in her right ear, hearing loss in her right ear, and right ear pruritis. She additionally notes cough, dry mouth, and sore throat. Pt has not measured a fever at home. She endorses pain exacerbation with palpation to her mass and with chewing. Pt denies ear discharge or any other associated symptoms. She uses Flonase and Zyrtec for her allergies. Pt also uses Voltaren.  Past Medical History  Diagnosis Date  . Fibroid (bleeding) (uterine)   . RLS (restless legs syndrome)   . Insomnia   . Anxiety   . Environmental allergies     ALLERGIES AND SINUS PROBLEMS  . Fall 07/10/13    FX'D LEFT ANKLE - DID HAVE A LOT OF SORENESS NECK AND RIGHT SHOULDER-BUT IMPROVING.; STATES FRACTURED ANKLE IS SPLINTED AND USING CRUTCHES AT HOME  . History of shingles 2010    RIGHT SIDE OF BACK - STILL HAS SENSATIONS OF MUSCLE CONTRACTIONS OR "LIGHTNING BOLT" SENSATION IN THAT AREA  . GERD (gastroesophageal reflux disease)     WITH CERTAIN FOODS -WOULD TAKE OTC MED IF NEEDED  . Chronic bronchitis ([place])     "at least once/yr; more lately since I moved here from [place]" (07/18/2013)  . Anemia     "when I was little"  . Sinus  headache   . Arthritis     "elbows; probably knees too" (07/18/2013)  . Sciatica   . Chronic upper back pain   . Depression    Past Surgical History  Procedure Laterality Date  . Orif ankle fracture Left 07/17/2013    "I fell on 07/10/2013"  . Cholecystectomy  1990's  . Tubal ligation  1990's  . Orif ankle fracture Left 07/17/2013    Procedure: OPEN REDUCTION INTERNAL FIXATION (ORIF) ANKLE FRACTURE;  Surgeon: Marianna Payment, MD;  Location: [place];  Service: Orthopedics;  Laterality: Left;   Family History  Problem Relation Age of Onset  . Cancer Mother   . Diabetes Mother   . Hypertension Father   . Heart disease Father   . Diabetes Sister   . Hyperlipidemia Sister   . Hypertension Sister    Social History  Substance Use Topics  . Smoking status: Former Smoker -- 0.50 packs/day for 30 years    Types: Cigarettes    Quit date: 07/10/2013  . Smokeless tobacco: Never Used  . Alcohol Use: Yes     Comment: 07/18/2013 "couple times/yr I'll have a drink; use to drink more than that"   OB History    No data available     Review of Systems  Constitutional: Negative for fever.  HENT: Positive for ear pain (right), hearing loss (right ear) and sore throat.  Negative for ear discharge.   Respiratory: Positive for cough.    Allergies  Review of patient's allergies indicates no known allergies.  Home Medications   Prior to Admission medications   Medication Sig Start Date End Date Taking? Authorizing Provider  albuterol (PROVENTIL HFA;VENTOLIN HFA) 108 (90 BASE) MCG/ACT inhaler Inhale into the lungs every 6 (six) hours as needed for wheezing or shortness of breath.    Historical Provider, MD  aspirin EC 325 MG tablet Take 325 mg by mouth daily.    Historical Provider, MD  cetirizine (ZYRTEC) 10 MG tablet Take 10 mg by mouth daily as needed for allergies.     Historical Provider, MD  cholecalciferol (VITAMIN D) 1000 UNITS tablet Take 1,000 Units by mouth daily.    Historical  Provider, MD  cyclobenzaprine (FLEXERIL) 10 MG tablet Take 10 mg by mouth 3 (three) times daily as needed for muscle spasms.    Historical Provider, MD  diclofenac (VOLTAREN) 75 MG EC tablet Take 75 mg by mouth 2 (two) times daily as needed for mild pain.    Historical Provider, MD  fenofibrate (TRICOR) 145 MG tablet Take 145 mg by mouth daily.    Historical Provider, MD  fluticasone (FLONASE) 50 MCG/ACT nasal spray Place 2 sprays into the nose daily as needed for allergies or rhinitis. PRN 08/10/12   Carvel Getting, NP  ibuprofen (ADVIL,MOTRIN) 200 MG tablet Take 400 mg by mouth every 6 (six) hours as needed for moderate pain.    Historical Provider, MD  metFORMIN (GLUCOPHAGE) 500 MG tablet Take 1,000 mg by mouth 2 (two) times daily with a meal.    Historical Provider, MD   BP 159/74 mmHg  Pulse 94  Temp(Src) 98 F (36.7 C) (Oral)  Resp 16  Wt 244 lb 9 oz (110.933 kg)  SpO2 90% Physical Exam  Constitutional: She appears well-developed and well-nourished. No distress.  HENT:  Head: Normocephalic and atraumatic.  Small, approximately 1x2cm mass/lymph node to the right sub auricular area. Does not appear to be infectious, no skin erythema, no induration. TMs normal bilaterally. Ear canal and external ear normal bilaterally. Oropharynx normal.   Eyes: Conjunctivae are normal.  Neck: Normal range of motion. Neck supple. No thyromegaly present.  Cardiovascular: Normal rate, regular rhythm and normal heart sounds.   Pulmonary/Chest: Effort normal and breath sounds normal. No respiratory distress. She has no wheezes. She has no rales.  Lymphadenopathy:    She has no cervical adenopathy.  Neurological: She is alert.  Skin: Skin is warm and dry.  Nursing note and vitals reviewed.   ED Course  Procedures (including critical care time)  DIAGNOSTIC STUDIES: Oxygen Saturation is 90% on RA, low by my interpretation.    COORDINATION OF CARE: 9:13 PM Discussed treatment plan with pt at bedside  and pt agreed to plan.  Labs Review Labs Reviewed - No data to display  Imaging Review No results found. I have personally reviewed and evaluated these images and lab results as part of my medical decision-making.   EKG Interpretation None      MDM   Final diagnoses:  Mass of submandibular region  Decreased hearing of right ear   Patient with a small mass versus lymph node to the right subauricular area. States it has been there about 3 months. Has seen her doctor who told her that it may go away, however it is still there in. Now is complaining of decreased hearing in the right ear. States she feels pressure  in the ear. She reports seasonal allergies, reports nasal congestion, sore throat, itchy watery eyes for several weeks for which she takes medications. This could be reactive lymph node from the inflammation versus could be cancerous lymph node versus lipoma. I will discharge her home with primary care doctor and ear nose throat follow-up for decreased hearing. Patient was given referral. Advised return precautions.  Filed Vitals:   09/07/15 2049  BP: 159/74  Pulse: 94  Temp: 98 F (36.7 C)  TempSrc: Oral  Resp: 16  Weight: 110.933 kg  SpO2: 90%    I personally performed the services described in this documentation, which was scribed in my presence. The recorded information has been reviewed and is accurate.   Jeannett Senior, PA-C 09/07/15 2148  Carmin Muskrat, MD 09/07/15 [phone]

## 2015-09-17 ENCOUNTER — Ambulatory Visit
Admission: RE | Admit: 2015-09-17 | Discharge: 2015-09-17 | Disposition: A | Discharge status: Home / Self Care | Payer: Medicaid Other | Source: Ambulatory Visit

## 2015-09-17 DIAGNOSIS — Z1231 Encounter for screening mammogram for malignant neoplasm of breast: Secondary | ICD-10-CM
